# Patient Record
Sex: Female | Born: 1965 | Hispanic: No | Marital: Married | Smoking: Never smoker
Health system: Southern US, Community
[De-identification: ages and names within clinical notes are randomized; demographics above are authoritative.]

---

## 2010-09-07 ENCOUNTER — Emergency Department (HOSPITAL_COMMUNITY)
Admission: EM | Admit: 2010-09-07 | Discharge: 2010-09-07 | Payer: Self-pay | Source: Home / Self Care | Admitting: Emergency Medicine
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
## 2012-06-12 ENCOUNTER — Other Ambulatory Visit (HOSPITAL_COMMUNITY)
Admission: RE | Admit: 2012-06-12 | Discharge: 2012-06-12 | Disposition: A | Discharge status: Home / Self Care | Payer: BC Managed Care – PPO | Source: Ambulatory Visit | Attending: Family Medicine | Admitting: Family Medicine

## 2012-06-12 DIAGNOSIS — Z124 Encounter for screening for malignant neoplasm of cervix: Secondary | ICD-10-CM | POA: Insufficient documentation

## 2015-09-28 ENCOUNTER — Encounter: Payer: Self-pay | Admitting: Women's Health

## 2015-09-28 ENCOUNTER — Other Ambulatory Visit (HOSPITAL_COMMUNITY)
Admission: RE | Admit: 2015-09-28 | Discharge: 2015-09-28 | Disposition: A | Discharge status: Home / Self Care | Payer: Managed Care, Other (non HMO) | Source: Ambulatory Visit | Attending: Women's Health | Admitting: Women's Health

## 2015-09-28 ENCOUNTER — Ambulatory Visit (INDEPENDENT_AMBULATORY_CARE_PROVIDER_SITE_OTHER): Payer: Managed Care, Other (non HMO) | Admitting: Women's Health

## 2015-09-28 VITALS — BP 110/72 | Ht 62.0 in | Wt 139.6 lb

## 2015-09-28 DIAGNOSIS — Z1151 Encounter for screening for human papillomavirus (HPV): Secondary | ICD-10-CM | POA: Insufficient documentation

## 2015-09-28 DIAGNOSIS — Z01419 Encounter for gynecological examination (general) (routine) without abnormal findings: Secondary | ICD-10-CM | POA: Diagnosis not present

## 2015-09-28 DIAGNOSIS — Z1329 Encounter for screening for other suspected endocrine disorder: Secondary | ICD-10-CM | POA: Diagnosis not present

## 2015-09-28 DIAGNOSIS — Z01411 Encounter for gynecological examination (general) (routine) with abnormal findings: Secondary | ICD-10-CM | POA: Insufficient documentation

## 2015-09-28 DIAGNOSIS — Z13220 Encounter for screening for lipoid disorders: Secondary | ICD-10-CM | POA: Diagnosis not present

## 2015-09-28 LAB — COMPREHENSIVE METABOLIC PANEL
ALBUMIN: 4 g/dL (ref 3.6–5.1)
ALK PHOS: 72 U/L (ref 33–115)
ALT: 17 U/L (ref 6–29)
AST: 22 U/L (ref 10–35)
BILIRUBIN TOTAL: 0.3 mg/dL (ref 0.2–1.2)
BUN: 14 mg/dL (ref 7–25)
CALCIUM: 9.2 mg/dL (ref 8.6–10.2)
CO2: 29 mmol/L (ref 20–31)
CREATININE: 0.64 mg/dL (ref 0.50–1.10)
Chloride: 101 mmol/L (ref 98–110)
Glucose, Bld: 72 mg/dL (ref 65–99)
Potassium: 3.9 mmol/L (ref 3.5–5.3)
Sodium: 139 mmol/L (ref 135–146)
TOTAL PROTEIN: 6.8 g/dL (ref 6.1–8.1)

## 2015-09-28 LAB — CBC WITH DIFFERENTIAL/PLATELET
Basophils Absolute: 0 10*3/uL (ref 0.0–0.1)
Basophils Relative: 0 % (ref 0–1)
Eosinophils Absolute: 0.3 10*3/uL (ref 0.0–0.7)
Eosinophils Relative: 4 % (ref 0–5)
HEMATOCRIT: 36.5 % (ref 36.0–46.0)
HEMOGLOBIN: 11.8 g/dL — AB (ref 12.0–15.0)
LYMPHS PCT: 28 % (ref 12–46)
Lymphs Abs: 1.8 10*3/uL (ref 0.7–4.0)
MCH: 27 pg (ref 26.0–34.0)
MCHC: 32.3 g/dL (ref 30.0–36.0)
MCV: 83.5 fL (ref 78.0–100.0)
MONO ABS: 0.4 10*3/uL (ref 0.1–1.0)
MPV: 9.6 fL (ref 8.6–12.4)
Monocytes Relative: 7 % (ref 3–12)
NEUTROS ABS: 3.9 10*3/uL (ref 1.7–7.7)
NEUTROS PCT: 61 % (ref 43–77)
Platelets: 286 10*3/uL (ref 150–400)
RBC: 4.37 MIL/uL (ref 3.87–5.11)
RDW: 12.8 % (ref 11.5–15.5)
WBC: 6.4 10*3/uL (ref 4.0–10.5)

## 2015-09-28 LAB — TSH: TSH: 1.932 u[IU]/mL (ref 0.350–4.500)

## 2015-09-28 LAB — LIPID PANEL
Cholesterol: 144 mg/dL (ref 125–200)
HDL: 67 mg/dL (ref >=46)
LDL CALC: 54 mg/dL (ref <130)
TRIGLYCERIDES: 117 mg/dL (ref <150)
Total CHOL/HDL Ratio: 2.1 Ratio (ref <=5.0)
VLDL: 23 mg/dL (ref <30)

## 2015-09-28 NOTE — Patient Instructions (Signed)
Health Maintenance, Female Adopting a healthy lifestyle and getting preventive care can go a long way to promote health and wellness. Talk with your health care provider about what schedule of regular examinations is right for you. This is a good chance for you to check in with your provider about disease prevention and staying healthy. In between checkups, there are plenty of things you can do on your own. Experts have done a lot of research about which lifestyle changes and preventive measures are most likely to keep you healthy. Ask your health care provider for more information. WEIGHT AND DIET  Eat a healthy diet  Be sure to include plenty of vegetables, fruits, low-fat dairy products, and lean protein.  Do not eat a lot of foods high in solid fats, added sugars, or salt.  Get regular exercise. This is one of the most important things you can do for your health.  Most adults should exercise for at least 150 minutes each week. The exercise should increase your heart rate and make you sweat (moderate-intensity exercise).  Most adults should also do strengthening exercises at least twice a week. This is in addition to the moderate-intensity exercise.  Maintain a healthy weight  Body mass index (BMI) is a measurement that can be used to identify possible weight problems. It estimates body fat based on height and weight. Your health care provider can help determine your BMI and help you achieve or maintain a healthy weight.  For females 20 years of age and older:   A BMI below 18.5 is considered underweight.  A BMI of 18.5 to 24.9 is normal.  A BMI of 25 to 29.9 is considered overweight.  A BMI of 30 and above is considered obese.  Watch levels of cholesterol and blood lipids  You should start having your blood tested for lipids and cholesterol at 47 years of age, then have this test every 5 years.  You may need to have your cholesterol levels checked more often if:  Your lipid  or cholesterol levels are high.  You are older than 47 years of age.  You are at high risk for heart disease.  CANCER SCREENING   Lung Cancer  Lung cancer screening is recommended for adults 55-80 years old who are at high risk for lung cancer because of a history of smoking.  A yearly low-dose CT scan of the lungs is recommended for people who:  Currently smoke.  Have quit within the past 15 years.  Have at least a 30-pack-year history of smoking. A pack year is smoking an average of one pack of cigarettes a day for 1 year.  Yearly screening should continue until it has been 15 years since you quit.  Yearly screening should stop if you develop a health problem that would prevent you from having lung cancer treatment.  Breast Cancer  Practice breast self-awareness. This means understanding how your breasts normally appear and feel.  It also means doing regular breast self-exams. Let your health care provider know about any changes, no matter how small.  If you are in your 20s or 30s, you should have a clinical breast exam (CBE) by a health care provider every 1-3 years as part of a regular health exam.  If you are 40 or older, have a CBE every year. Also consider having a breast X-ray (mammogram) every year.  If you have a family history of breast cancer, talk to your health care provider about genetic screening.  If you   are at high risk for breast cancer, talk to your health care provider about having an MRI and a mammogram every year.  Breast cancer gene (BRCA) assessment is recommended for women who have family members with BRCA-related cancers. BRCA-related cancers include:  Breast.  Ovarian.  Tubal.  Peritoneal cancers.  Results of the assessment will determine the need for genetic counseling and BRCA1 and BRCA2 testing. Cervical Cancer Your health care provider may recommend that you be screened regularly for cancer of the pelvic organs (ovaries, uterus, and  vagina). This screening involves a pelvic examination, including checking for microscopic changes to the surface of your cervix (Pap test). You may be encouraged to have this screening done every 3 years, beginning at age 21.  For women ages 30-65, health care providers may recommend pelvic exams and Pap testing every 3 years, or they may recommend the Pap and pelvic exam, combined with testing for human papilloma virus (HPV), every 5 years. Some types of HPV increase your risk of cervical cancer. Testing for HPV may also be done on women of any age with unclear Pap test results.  Other health care providers may not recommend any screening for nonpregnant women who are considered low risk for pelvic cancer and who do not have symptoms. Ask your health care provider if a screening pelvic exam is right for you.  If you have had past treatment for cervical cancer or a condition that could lead to cancer, you need Pap tests and screening for cancer for at least 20 years after your treatment. If Pap tests have been discontinued, your risk factors (such as having a new sexual partner) need to be reassessed to determine if screening should resume. Some women have medical problems that increase the chance of getting cervical cancer. In these cases, your health care provider may recommend more frequent screening and Pap tests. Colorectal Cancer  This type of cancer can be detected and often prevented.  Routine colorectal cancer screening usually begins at 47 years of age and continues through 47 years of age.  Your health care provider may recommend screening at an earlier age if you have risk factors for colon cancer.  Your health care provider may also recommend using home test kits to check for hidden blood in the stool.  A small camera at the end of a tube can be used to examine your colon directly (sigmoidoscopy or colonoscopy). This is done to check for the earliest forms of colorectal  cancer.  Routine screening usually begins at age 50.  Direct examination of the colon should be repeated every 5-10 years through 47 years of age. However, you may need to be screened more often if early forms of precancerous polyps or small growths are found. Skin Cancer  Check your skin from head to toe regularly.  Tell your health care provider about any new moles or changes in moles, especially if there is a change in a mole's shape or color.  Also tell your health care provider if you have a mole that is larger than the size of a pencil eraser.  Always use sunscreen. Apply sunscreen liberally and repeatedly throughout the day.  Protect yourself by wearing long sleeves, pants, a wide-brimmed hat, and sunglasses whenever you are outside. HEART DISEASE, DIABETES, AND HIGH BLOOD PRESSURE   High blood pressure causes heart disease and increases the risk of stroke. High blood pressure is more likely to develop in:  People who have blood pressure in the high end   of the normal range (130-139/85-89 mm Hg).  People who are overweight or obese.  People who are African American.  If you are 38-23 years of age, have your blood pressure checked every 3-5 years. If you are 61 years of age or older, have your blood pressure checked every year. You should have your blood pressure measured twice--once when you are at a hospital or clinic, and once when you are not at a hospital or clinic. Record the average of the two measurements. To check your blood pressure when you are not at a hospital or clinic, you can use:  An automated blood pressure machine at a pharmacy.  A home blood pressure monitor.  If you are between 45 years and 39 years old, ask your health care provider if you should take aspirin to prevent strokes.  Have regular diabetes screenings. This involves taking a blood sample to check your fasting blood sugar level.  If you are at a normal weight and have a low risk for diabetes,  have this test once every three years after 47 years of age.  If you are overweight and have a high risk for diabetes, consider being tested at a younger age or more often. PREVENTING INFECTION  Hepatitis B  If you have a higher risk for hepatitis B, you should be screened for this virus. You are considered at high risk for hepatitis B if:  You were born in a country where hepatitis B is common. Ask your health care provider which countries are considered high risk.  Your parents were born in a high-risk country, and you have not been immunized against hepatitis B (hepatitis B vaccine).  You have HIV or AIDS.  You use needles to inject street drugs.  You live with someone who has hepatitis B.  You have had sex with someone who has hepatitis B.  You get hemodialysis treatment.  You take certain medicines for conditions, including cancer, organ transplantation, and autoimmune conditions. Hepatitis C  Blood testing is recommended for:  Everyone born from [birth year] through 1963.  Anyone with known risk factors for hepatitis C. Sexually transmitted infections (STIs)  You should be screened for sexually transmitted infections (STIs) including gonorrhea and chlamydia if:  You are sexually active and are younger than 47 years of age.  You are older than 47 years of age and your health care provider tells you that you are at risk for this type of infection.  Your sexual activity has changed since you were last screened and you are at an increased risk for chlamydia or gonorrhea. Ask your health care provider if you are at risk.  If you do not have HIV, but are at risk, it may be recommended that you take a prescription medicine daily to prevent HIV infection. This is called pre-exposure prophylaxis (PrEP). You are considered at risk if:  You are sexually active and do not regularly use condoms or know the HIV status of your partner(s).  You take drugs by injection.  You are sexually  active with a partner who has HIV. Talk with your health care provider about whether you are at high risk of being infected with HIV. If you choose to begin PrEP, you should first be tested for HIV. You should then be tested every 3 months for as long as you are taking PrEP.  PREGNANCY   If you are premenopausal and you may become pregnant, ask your health care provider about preconception counseling.  If you may  become pregnant, take 400 to 800 micrograms (mcg) of folic acid every day.  If you want to prevent pregnancy, talk to your health care provider about birth control (contraception). OSTEOPOROSIS AND MENOPAUSE   Osteoporosis is a disease in which the bones lose minerals and strength with aging. This can result in serious bone fractures. Your risk for osteoporosis can be identified using a bone density scan.  If you are 61 years of age or older, or if you are at risk for osteoporosis and fractures, ask your health care provider if you should be screened.  Ask your health care provider whether you should take a calcium or vitamin D supplement to lower your risk for osteoporosis.  Menopause may have certain physical symptoms and risks.  Hormone replacement therapy may reduce some of these symptoms and risks. Talk to your health care provider about whether hormone replacement therapy is right for you.  HOME CARE INSTRUCTIONS   Schedule regular health, dental, and eye exams.  Stay current with your immunizations.   Do not use any tobacco products including cigarettes, chewing tobacco, or electronic cigarettes.  If you are pregnant, do not drink alcohol.  If you are breastfeeding, limit how much and how often you drink alcohol.  Limit alcohol intake to no more than 1 drink per day for nonpregnant women. One drink equals 12 ounces of beer, 5 ounces of wine, or 1 ounces of hard liquor.  Do not use street drugs.  Do not share needles.  Ask your health care provider for help if  you need support or information about quitting drugs.  Tell your health care provider if you often feel depressed.  Tell your health care provider if you have ever been abused or do not feel safe at home.   This information is not intended to replace advice given to you by your health care provider. Make sure you discuss any questions you have with your health care provider.   Document Released: 04/30/2011 Document Revised: 11/05/2014 Document Reviewed: 09/16/2013 Elsevier Interactive Patient Education [employer].

## 2015-09-28 NOTE — Progress Notes (Signed)
Lamara Brecht 1965-12-27 [id number]    History:    Presents for new patient annual exam.  Monthly cycle/BTL. States cycles are changing slightly in the past year and are more unpredictable in timing, monthly and no bleeding between cycles. Reports normal Pap history 1 mammogram several years ago normal.  Past medical history, past surgical history, family history and social history were all reviewed and documented in the EPIC chart. 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 Father diabetes. Soft mobile 3 cm questionable lipoma on right neck biopsied negative several years ago in [place], no change.  ROS:  A ROS was performed and pertinent positives and negatives are included.  Exam:  Filed Vitals:   09/28/15 1407  BP: 110/72    General appearance:  Normal Thyroid:  Symmetrical, normal in size, without palpable masses or nodularity. Respiratory  Auscultation:  Clear without wheezing or rhonchi Cardiovascular  Auscultation:  Regular rate, without rubs, murmurs or gallops  Edema/varicosities:  Not grossly evident Abdominal  Soft,nontender, without masses, guarding or rebound.  Liver/spleen:  No organomegaly noted  Hernia:  None appreciated  Skin  Inspection:  Grossly normal   Breasts: Examined lying and sitting.     Right: Without masses, retractions, discharge or axillary adenopathy.     Left: Without masses, retractions, discharge or axillary adenopathy. Gentitourinary   Inguinal/mons:  Normal without inguinal adenopathy  External genitalia:  Normal  BUS/Urethra/Skene's glands:  Normal  Vagina:  Normal  Cervix:  Normal  Uterus:   normal in size, shape and contour.  Midline and mobile  Adnexa/parametria:     Rt: Without masses or tenderness.   Lt: Without masses or tenderness.  Anus and perineum: Normal  Digital rectal exam: Normal sphincter tone without palpated masses or tenderness  Assessment/Plan:  49 y.o. MAF G2  P2 for annual exam with complaints of leg, foot and neck discomfort.  Monthly cycle/BTL Perimenopausal  Plan: Encouraged orthotics for shoes, proper lifting, reviewed most of discomfort sounds related to requirements of job. Regular exercise, vitamin D supplement encouraged. Vegetarian. SBE's, annual screening mammogram reviewed and encouraged breast center information given instructed to schedule. CBC, TSH, lipid panel, UA, Pap with HR HPV typing.    [place], 4:59 PM 09/28/2015

## 2015-10-03 LAB — CYTOLOGY - PAP

## 2016-09-28 ENCOUNTER — Other Ambulatory Visit: Payer: Self-pay | Admitting: Women's Health

## 2016-09-28 ENCOUNTER — Encounter: Payer: Self-pay | Admitting: Women's Health

## 2016-09-28 ENCOUNTER — Ambulatory Visit (INDEPENDENT_AMBULATORY_CARE_PROVIDER_SITE_OTHER): Payer: Managed Care, Other (non HMO) | Admitting: Women's Health

## 2016-09-28 VITALS — BP 124/80 | Ht 62.0 in | Wt 132.0 lb

## 2016-09-28 DIAGNOSIS — Z01419 Encounter for gynecological examination (general) (routine) without abnormal findings: Secondary | ICD-10-CM

## 2016-09-28 DIAGNOSIS — F5101 Primary insomnia: Secondary | ICD-10-CM | POA: Diagnosis not present

## 2016-09-28 DIAGNOSIS — Z13220 Encounter for screening for lipoid disorders: Secondary | ICD-10-CM | POA: Diagnosis not present

## 2016-09-28 DIAGNOSIS — Z1329 Encounter for screening for other suspected endocrine disorder: Secondary | ICD-10-CM | POA: Diagnosis not present

## 2016-09-28 DIAGNOSIS — Z1231 Encounter for screening mammogram for malignant neoplasm of breast: Secondary | ICD-10-CM

## 2016-09-28 LAB — CBC WITH DIFFERENTIAL/PLATELET
Basophils Absolute: 0 cells/uL (ref 0–200)
Basophils Relative: 0 %
EOS PCT: 4 %
Eosinophils Absolute: 220 cells/uL (ref 15–500)
HCT: 37.5 % (ref 35.0–45.0)
HEMOGLOBIN: 12.1 g/dL (ref 11.7–15.5)
LYMPHS ABS: 2200 {cells}/uL (ref 850–3900)
Lymphocytes Relative: 40 %
MCH: 27.6 pg (ref 27.0–33.0)
MCHC: 32.3 g/dL (ref 32.0–36.0)
MCV: 85.4 fL (ref 80.0–100.0)
MONOS PCT: 6 %
MPV: 9.6 fL (ref 7.5–12.5)
Monocytes Absolute: 330 cells/uL (ref 200–950)
NEUTROS ABS: 2750 {cells}/uL (ref 1500–7800)
NEUTROS PCT: 50 %
PLATELETS: 250 10*3/uL (ref 140–400)
RBC: 4.39 MIL/uL (ref 3.80–5.10)
RDW: 13.2 % (ref 11.0–15.0)
WBC: 5.5 10*3/uL (ref 3.8–10.8)

## 2016-09-28 LAB — LIPID PANEL
Cholesterol: 141 mg/dL (ref <200)
HDL: 60 mg/dL (ref >50)
LDL CALC: 56 mg/dL (ref <100)
TRIGLYCERIDES: 124 mg/dL (ref <150)
Total CHOL/HDL Ratio: 2.4 Ratio (ref <5.0)
VLDL: 25 mg/dL (ref <30)

## 2016-09-28 LAB — COMPREHENSIVE METABOLIC PANEL
ALBUMIN: 4.1 g/dL (ref 3.6–5.1)
ALT: 13 U/L (ref 6–29)
AST: 20 U/L (ref 10–35)
Alkaline Phosphatase: 66 U/L (ref 33–115)
BILIRUBIN TOTAL: 0.2 mg/dL (ref 0.2–1.2)
BUN: 14 mg/dL (ref 7–25)
CHLORIDE: 102 mmol/L (ref 98–110)
CO2: 27 mmol/L (ref 20–31)
CREATININE: 0.63 mg/dL (ref 0.50–1.10)
Calcium: 8.8 mg/dL (ref 8.6–10.2)
GLUCOSE: 68 mg/dL (ref 65–99)
Potassium: 3.6 mmol/L (ref 3.5–5.3)
SODIUM: 138 mmol/L (ref 135–146)
Total Protein: 6.7 g/dL (ref 6.1–8.1)

## 2016-09-28 LAB — TSH: TSH: 2.49 m[IU]/L

## 2016-09-28 MED ORDER — ZOLPIDEM TARTRATE 10 MG PO TABS: 10.0000 mg | ORAL_TABLET | Freq: Every evening | ORAL | 0 refills | Status: DC | PRN

## 2016-09-28 NOTE — Progress Notes (Signed)
Brittany Zhang 10/21/1966 [id number]    History:    Presents for annual exam.  Cycles have become more irregular this past year, had been every month now every 1-3 months. BTL. Normal Pap and mammogram history, overdue for mammogram. Has a questionable lipoma on right side of neck approximately 3 cm that has been present for greater than 8 years but in the past year has become larger and is now causing a discomfort that radiates up the side of her head behind her right ear. Reports had a negative biopsy of the site in [place] in the past. (Originally from [place] Complaint of insomnia states unable to sleep well ever, denies any stress or problems in life.  Past medical history, past surgical history, family history and social history were all reviewed and documented in the EPIC chart. 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 Son currently in medical school in [place]. Daughter at College here. Father diabetes.  ROS:  A ROS was performed and pertinent positives and negatives are included.  Exam:  Vitals:   09/28/16 1403  BP: 124/80  Weight: 132 lb (59.9 kg)  Height: 5' 2" (1.575 m)   Body mass index is 24.14 kg/m.   General appearance:  Normal Thyroid:  Symmetrical, normal in size, without palpable masses or nodularity. Respiratory  Auscultation:  Clear without wheezing or rhonchi Cardiovascular  Auscultation:  Regular rate, without rubs, murmurs or gallops  Edema/varicosities:  Not grossly evident Abdominal  Soft,nontender, without masses, guarding or rebound.  Liver/spleen:  No organomegaly noted  Hernia:  None appreciated  Skin  Inspection:  Grossly normal   Breasts: Examined lying and sitting.     Right: Without masses, retractions, discharge or axillary adenopathy.     Left: Without masses, retractions, discharge or axillary adenopathy. Gentitourinary   Inguinal/mons:  Normal without inguinal  adenopathy  External genitalia:  Normal  BUS/Urethra/Skene's glands:  Normal  Vagina:  Normal  Cervix:  Normal  Uterus:   normal in size, shape and contour.  Midline and mobile  Adnexa/parametria:     Rt: Without masses or tenderness.   Lt: Without masses or tenderness.  Anus and perineum: Normal  Digital rectal exam: Normal sphincter tone without palpated masses or tenderness  Assessment/Plan:  50 y.o. MAF G2 P2  for annual exam.    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 Overdue for mammogram, breast center information given instructed to schedule. Reviewed importance of annual screen. Long discussion on insomnia, sleep hygiene. Ambien 10 mg will start with half tablet, instructions reviewed. Instructed not to take greater than one week if no relief of insomnia instructed to call. Encouraged orthotics for work shoes. Exercise, calcium rich diet, vitamin D 1000 daily encouraged. CBC, lipid panel, vitamin D, CMP, UA, Pap normal with negative HR HPV 2016, new screening guidelines reviewed.    YOUNG,NANCY J WHNP, 3:52 PM 09/28/2016 

## 2016-09-28 NOTE — Patient Instructions (Signed)
Breast center for mammogram  [phone] Insomnia Insomnia is a sleep disorder that makes it difficult to fall asleep or to stay asleep. Insomnia can cause tiredness (fatigue), low energy, difficulty concentrating, mood swings, and poor performance at work or school. There are three different ways to classify insomnia:  Difficulty falling asleep.  Difficulty staying asleep.  Waking up too early in the morning. Any type of insomnia can be long-term (chronic) or short-term (acute). Both are common. Short-term insomnia usually lasts for three months or less. Chronic insomnia occurs at least three times a week for longer than three months. What are the causes? Insomnia may be caused by another condition, situation, or substance, such as:  Anxiety.  Certain medicines.  Gastroesophageal reflux disease (GERD) or other gastrointestinal conditions.  Asthma or other breathing conditions.  Restless legs syndrome, sleep apnea, or other sleep disorders.  Chronic pain.  Menopause. This may include hot flashes.  Stroke.  Abuse of alcohol, tobacco, or illegal drugs.  Depression.  Caffeine.  Neurological disorders, such as Alzheimer disease.  An overactive thyroid (hyperthyroidism). The cause of insomnia may not be known. What increases the risk? Risk factors for insomnia include:  Gender. Women are more commonly affected than men.  Age. Insomnia is more common as you get older.  Stress. This may involve your professional or personal life.  Income. Insomnia is more common in people with lower income.  Lack of exercise.  Irregular work schedule or night shifts.  Traveling between different time zones. What are the signs or symptoms? If you have insomnia, trouble falling asleep or trouble staying asleep is the main symptom. This may lead to other symptoms, such as:  Feeling fatigued.  Feeling nervous about going to sleep.  Not feeling rested in the morning.  Having trouble  concentrating.  Feeling irritable, anxious, or depressed. How is this treated? Treatment for insomnia depends on the cause. If your insomnia is caused by an underlying condition, treatment will focus on addressing the condition. Treatment may also include:  Medicines to help you sleep.  Counseling or therapy.  Lifestyle adjustments. Follow these instructions at home:  Take medicines only as directed by your health care provider.  Keep regular sleeping and waking hours. Avoid naps.  Keep a sleep diary to help you and your health care provider figure out what could be causing your insomnia. Include:  When you sleep.  When you wake up during the night.  How well you sleep.  How rested you feel the next day.  Any side effects of medicines you are taking.  What you eat and drink.  Make your bedroom a comfortable place where it is easy to fall asleep:  Put up shades or special blackout curtains to block light from outside.  Use a white noise machine to block noise.  Keep the temperature cool.  Exercise regularly as directed by your health care provider. Avoid exercising right before bedtime.  Use relaxation techniques to manage stress. Ask your health care provider to suggest some techniques that may work well for you. These may include:  Breathing exercises.  Routines to release muscle tension.  Visualizing peaceful scenes.  Cut back on alcohol, caffeinated beverages, and cigarettes, especially close to bedtime. These can disrupt your sleep.  Do not overeat or eat spicy foods right before bedtime. This can lead to digestive discomfort that can make it hard for you to sleep.  Limit screen use before bedtime. This includes:  Watching TV.  Using your smartphone, tablet,  and computer.  Stick to a routine. This can help you fall asleep faster. Try to do a quiet activity, brush your teeth, and go to bed at the same time each night.  Get out of bed if you are still  awake after 15 minutes of trying to sleep. Keep the lights down, but try reading or doing a quiet activity. When you feel sleepy, go back to bed.  Make sure that you drive carefully. Avoid driving if you feel very sleepy.  Keep all follow-up appointments as directed by your health care provider. This is important. Contact a health care provider if:  You are tired throughout the day or have trouble in your daily routine due to sleepiness.  You continue to have sleep problems or your sleep problems get worse. Get help right away if:  You have serious thoughts about hurting yourself or someone else. This information is not intended to replace advice given to you by your health care provider. Make sure you discuss any questions you have with your health care provider. Document Released: 10/12/2000 Document Revised: 03/16/2016 Document Reviewed: 07/16/2014 Elsevier Interactive Patient Education  2017 [place] Maintenance, Female Introduction Adopting a healthy lifestyle and getting preventive care can go a long way to promote health and wellness. Talk with your health care provider about what schedule of regular examinations is right for you. This is a good chance for you to check in with your provider about disease prevention and staying healthy. In between checkups, there are plenty of things you can do on your own. Experts have done a lot of research about which lifestyle changes and preventive measures are most likely to keep you healthy. Ask your health care provider for more information. Weight and diet Eat a healthy diet  Be sure to include plenty of vegetables, fruits, low-fat dairy products, and lean protein.  Do not eat a lot of foods high in solid fats, added sugars, or salt.  Get regular exercise. This is one of the most important things you can do for your health.  Most adults should exercise for at least 150 minutes each week. The exercise should increase your heart  rate and make you sweat (moderate-intensity exercise).  Most adults should also do strengthening exercises at least twice a week. This is in addition to the moderate-intensity exercise. Maintain a healthy weight  Body mass index (BMI) is a measurement that can be used to identify possible weight problems. It estimates body fat based on height and weight. Your health care provider can help determine your BMI and help you achieve or maintain a healthy weight.  For females 66 years of age and older:  A BMI below 18.5 is considered underweight.  A BMI of 18.5 to 24.9 is normal.  A BMI of 25 to 29.9 is considered overweight.  A BMI of 30 and above is considered obese. Watch levels of cholesterol and blood lipids  You should start having your blood tested for lipids and cholesterol at 48 years of age, then have this test every 5 years.  You may need to have your cholesterol levels checked more often if:  Your lipid or cholesterol levels are high.  You are older than 48 years of age.  You are at high risk for heart disease. Cancer screening Lung Cancer  Lung cancer screening is recommended for adults 55-39 years old who are at high risk for lung cancer because of a history of smoking.  A yearly low-dose CT scan  of the lungs is recommended for people who:  Currently smoke.  Have quit within the past 15 years.  Have at least a 30-pack-year history of smoking. A pack year is smoking an average of one pack of cigarettes a day for 1 year.  Yearly screening should continue until it has been 15 years since you quit.  Yearly screening should stop if you develop a health problem that would prevent you from having lung cancer treatment. Breast Cancer  Practice breast self-awareness. This means understanding how your breasts normally appear and feel.  It also means doing regular breast self-exams. Let your health care provider know about any changes, no matter how small.  If you are in  your 20s or 30s, you should have a clinical breast exam (CBE) by a health care provider every 1-3 years as part of a regular health exam.  If you are 74 or older, have a CBE every year. Also consider having a breast X-ray (mammogram) every year.  If you have a family history of breast cancer, talk to your health care provider about genetic screening.  If you are at high risk for breast cancer, talk to your health care provider about having an MRI and a mammogram every year.  Breast cancer gene (BRCA) assessment is recommended for women who have family members with BRCA-related cancers. BRCA-related cancers include:  Breast.  Ovarian.  Tubal.  Peritoneal cancers.  Results of the assessment will determine the need for genetic counseling and BRCA1 and BRCA2 testing. Cervical Cancer  Your health care provider may recommend that you be screened regularly for cancer of the pelvic organs (ovaries, uterus, and vagina). This screening involves a pelvic examination, including checking for microscopic changes to the surface of your cervix (Pap test). You may be encouraged to have this screening done every 3 years, beginning at age 48.  For women ages 31-65, health care providers may recommend pelvic exams and Pap testing every 3 years, or they may recommend the Pap and pelvic exam, combined with testing for human papilloma virus (HPV), every 5 years. Some types of HPV increase your risk of cervical cancer. Testing for HPV may also be done on women of any age with unclear Pap test results.  Other health care providers may not recommend any screening for nonpregnant women who are considered low risk for pelvic cancer and who do not have symptoms. Ask your health care provider if a screening pelvic exam is right for you.  If you have had past treatment for cervical cancer or a condition that could lead to cancer, you need Pap tests and screening for cancer for at least 20 years after your treatment. If  Pap tests have been discontinued, your risk factors (such as having a new sexual partner) need to be reassessed to determine if screening should resume. Some women have medical problems that increase the chance of getting cervical cancer. In these cases, your health care provider may recommend more frequent screening and Pap tests. Colorectal Cancer  This type of cancer can be detected and often prevented.  Routine colorectal cancer screening usually begins at 48 years of age and continues through 48 years of age.  Your health care provider may recommend screening at an earlier age if you have risk factors for colon cancer.  Your health care provider may also recommend using home test kits to check for hidden blood in the stool.  A small camera at the end of a tube can be used to  examine your colon directly (sigmoidoscopy or colonoscopy). This is done to check for the earliest forms of colorectal cancer.  Routine screening usually begins at age 20.  Direct examination of the colon should be repeated every 5-10 years through 48 years of age. However, you may need to be screened more often if early forms of precancerous polyps or small growths are found. Skin Cancer  Check your skin from head to toe regularly.  Tell your health care provider about any new moles or changes in moles, especially if there is a change in a mole's shape or color.  Also tell your health care provider if you have a mole that is larger than the size of a pencil eraser.  Always use sunscreen. Apply sunscreen liberally and repeatedly throughout the day.  Protect yourself by wearing long sleeves, pants, a wide-brimmed hat, and sunglasses whenever you are outside. Heart disease, diabetes, and high blood pressure  High blood pressure causes heart disease and increases the risk of stroke. High blood pressure is more likely to develop in:  People who have blood pressure in the high end of the normal range (130-139/85-89  mm Hg).  People who are overweight or obese.  People who are African American.  If you are 65-22 years of age, have your blood pressure checked every 3-5 years. If you are 13 years of age or older, have your blood pressure checked every year. You should have your blood pressure measured twice-once when you are at a hospital or clinic, and once when you are not at a hospital or clinic. Record the average of the two measurements. To check your blood pressure when you are not at a hospital or clinic, you can use:  An automated blood pressure machine at a pharmacy.  A home blood pressure monitor.  If you are between 18 years and 65 years old, ask your health care provider if you should take aspirin to prevent strokes.  Have regular diabetes screenings. This involves taking a blood sample to check your fasting blood sugar level.  If you are at a normal weight and have a low risk for diabetes, have this test once every three years after 48 years of age.  If you are overweight and have a high risk for diabetes, consider being tested at a younger age or more often. Preventing infection Hepatitis B  If you have a higher risk for hepatitis B, you should be screened for this virus. You are considered at high risk for hepatitis B if:  You were born in a country where hepatitis B is common. Ask your health care provider which countries are considered high risk.  Your parents were born in a high-risk country, and you have not been immunized against hepatitis B (hepatitis B vaccine).  You have HIV or AIDS.  You use needles to inject street drugs.  You live with someone who has hepatitis B.  You have had sex with someone who has hepatitis B.  You get hemodialysis treatment.  You take certain medicines for conditions, including cancer, organ transplantation, and autoimmune conditions. Hepatitis C  Blood testing is recommended for:  Everyone born from [birth year] through 1963.  Anyone with known  risk factors for hepatitis C. Sexually transmitted infections (STIs)  You should be screened for sexually transmitted infections (STIs) including gonorrhea and chlamydia if:  You are sexually active and are younger than 48 years of age.  You are older than 48 years of age and your health care provider  tells you that you are at risk for this type of infection.  Your sexual activity has changed since you were last screened and you are at an increased risk for chlamydia or gonorrhea. Ask your health care provider if you are at risk.  If you do not have HIV, but are at risk, it may be recommended that you take a prescription medicine daily to prevent HIV infection. This is called pre-exposure prophylaxis (PrEP). You are considered at risk if:  You are sexually active and do not regularly use condoms or know the HIV status of your partner(s).  You take drugs by injection.  You are sexually active with a partner who has HIV. Talk with your health care provider about whether you are at high risk of being infected with HIV. If you choose to begin PrEP, you should first be tested for HIV. You should then be tested every 3 months for as long as you are taking PrEP. Pregnancy  If you are premenopausal and you may become pregnant, ask your health care provider about preconception counseling.  If you may become pregnant, take 400 to 800 micrograms (mcg) of folic acid every day.  If you want to prevent pregnancy, talk to your health care provider about birth control (contraception). Osteoporosis and menopause  Osteoporosis is a disease in which the bones lose minerals and strength with aging. This can result in serious bone fractures. Your risk for osteoporosis can be identified using a bone density scan.  If you are 66 years of age or older, or if you are at risk for osteoporosis and fractures, ask your health care provider if you should be screened.  Ask your health care provider whether you  should take a calcium or vitamin D supplement to lower your risk for osteoporosis.  Menopause may have certain physical symptoms and risks.  Hormone replacement therapy may reduce some of these symptoms and risks. Talk to your health care provider about whether hormone replacement therapy is right for you. Follow these instructions at home:  Schedule regular health, dental, and eye exams.  Stay current with your immunizations.  Do not use any tobacco products including cigarettes, chewing tobacco, or electronic cigarettes.  If you are pregnant, do not drink alcohol.  If you are breastfeeding, limit how much and how often you drink alcohol.  Limit alcohol intake to no more than 1 drink per day for nonpregnant women. One drink equals 12 ounces of beer, 5 ounces of wine, or 1 ounces of hard liquor.  Do not use street drugs.  Do not share needles.  Ask your health care provider for help if you need support or information about quitting drugs.  Tell your health care provider if you often feel depressed.  Tell your health care provider if you have ever been abused or do not feel safe at home. This information is not intended to replace advice given to you by your health care provider. Make sure you discuss any questions you have with your health care provider. Document Released: 04/30/2011 Document Revised: 03/22/2016 Document Reviewed: 07/19/2015  2017 Elsevier

## 2016-09-29 LAB — URINALYSIS W MICROSCOPIC + REFLEX CULTURE
BACTERIA UA: NONE SEEN [HPF]
BILIRUBIN URINE: NEGATIVE
CRYSTALS: NONE SEEN [HPF]
Casts: NONE SEEN [LPF]
Glucose, UA: NEGATIVE
Ketones, ur: NEGATIVE
Leukocytes, UA: NEGATIVE
Nitrite: NEGATIVE
PROTEIN: NEGATIVE
RBC / HPF: NONE SEEN RBC/HPF (ref <=2)
SQUAMOUS EPITHELIAL / LPF: NONE SEEN [HPF] (ref <=5)
Specific Gravity, Urine: 1.008 (ref 1.001–1.035)
WBC, UA: NONE SEEN WBC/HPF (ref <=5)
YEAST: NONE SEEN [HPF]
pH: 6 (ref 5.0–8.0)

## 2016-09-29 LAB — VITAMIN D 25 HYDROXY (VIT D DEFICIENCY, FRACTURES): VIT D 25 HYDROXY: 32 ng/mL (ref 30–100)

## 2016-10-01 ENCOUNTER — Telehealth: Payer: Self-pay | Admitting: *Deleted

## 2016-10-01 NOTE — Telephone Encounter (Signed)
[facility]erral [facility]ed to [facility] they will fax me back time and date to relay to patient.

## 2016-10-01 NOTE — Telephone Encounter (Signed)
-----   Message from Harrington ChallengerNancy J Young, NP sent at 09/28/2016  3:48 PM EST ----- please schedule surgical consult for questionable 3 cm lipoma on right side outer aspect of neck that is now causing discomfort. Present greater than 8 years, has increased in size and now causing discomfort that radiates up the neck behind her right ear.

## 2016-10-02 NOTE — Telephone Encounter (Signed)
Appointment on 10/05/16 @ 9:30am pt informed with the below note.

## 2016-10-05 ENCOUNTER — Ambulatory Visit: Payer: Self-pay | Admitting: Surgery

## 2016-10-05 NOTE — H&P (Signed)
History of Present Illness Brittany Zhang(Nikolis Berent K. Danyal Adorno MD; 10/05/2016 10:24 AM) Patient words: NP lipoma on right side of neck.  The patient is a 50 year old female who presents with a complaint of Mass. Referred by Maryelizabeth RowanNancy Young, NP This is a 50 year old female in good health who presents with an 8 year history of a slowly enlarging mass on the right side of her neck. Apparently a needle biopsy was unremarkable. [place] was [place] back [place]. This mass has begun to enlarge and is causing some discomfort when she turns her neck. She would like to have this area removed.     Other Problems Aggie Cosier(Robin Gwynn, RMA; 10/05/2016 10:16 AM) No pertinent past medical history  Past Surgical History Aggie Cosier(Robin Gwynn, RMA; 10/05/2016 10:16 AM) No pertinent past surgical history  Diagnostic Studies History Aggie Cosier(Robin Gwynn, RMA; 10/05/2016 10:16 AM) Colonoscopy never Mammogram >3 years ago  Allergies Zella Ball(Robin Gwynn, RMA; 10/05/2016 10:16 AM) No Known Drug Allergies 10/05/2016  Medication History Zella Ball(Robin Gwynn, RMA; 10/05/2016 10:18 AM) Multi Vitamin (Oral) Active. Vitamin D (1000UNIT Tablet, Oral) Active. Medications Reconciled  Social History Aggie Cosier(Robin Gwynn, RMA; 10/05/2016 10:16 AM) No alcohol use No caffeine use No drug use Tobacco use Never smoker.  Family History Aggie Cosier(Robin Gwynn, RMA; 10/05/2016 10:16 AM) Diabetes Mellitus Father. Heart Disease Father. Hypertension Father.  Pregnancy / Birth History Aggie Cosier(Robin Gwynn, RMA; 10/05/2016 10:16 AM) Age at menarche 15 years. Age of menopause 896-50 Gravida 2 Length (months) of breastfeeding 7-12 Maternal age 48-25 Para 2 Regular periods     Review of Systems Zella Ball(Robin Gwynn RMA; 10/05/2016 10:16 AM) General Not Present- Appetite Loss, Chills, Fatigue, Fever, Night Sweats, Weight Gain and Weight Loss. Skin Not Present- Change in Wart/Mole, Dryness, Hives, Jaundice, New Lesions, Non-Healing Wounds, Rash and Ulcer. HEENT Not Present- Earache, Hearing  Loss, Hoarseness, Nose Bleed, Oral Ulcers, Ringing in the Ears, Seasonal Allergies, Sinus Pain, Sore Throat, Visual Disturbances, Wears glasses/contact lenses and Yellow Eyes. Respiratory Not Present- Bloody sputum, Chronic Cough, Difficulty Breathing, Snoring and Wheezing. Breast Not Present- Breast Mass, Breast Pain, Nipple Discharge and Skin Changes. Cardiovascular Not Present- Chest Pain, Difficulty Breathing Lying Down, Leg Cramps, Palpitations, Rapid Heart Rate, Shortness of Breath and Swelling of Extremities. Gastrointestinal Not Present- Abdominal Pain, Bloating, Bloody Stool, Change in Bowel Habits, Chronic diarrhea, Constipation, Difficulty Swallowing, Excessive gas, Gets full quickly at meals, Hemorrhoids, Indigestion, Nausea, Rectal Pain and Vomiting. Female Genitourinary Not Present- Frequency, Nocturia, Painful Urination, Pelvic Pain and Urgency. Musculoskeletal Not Present- Back Pain, Joint Pain, Joint Stiffness, Muscle Pain, Muscle Weakness and Swelling of Extremities. Neurological Not Present- Decreased Memory, Fainting, Headaches, Numbness, Seizures, Tingling, Tremor, Trouble walking and Weakness. Psychiatric Not Present- Anxiety, Bipolar, Change in Sleep Pattern, Depression, Fearful and Frequent crying. Endocrine Not Present- Cold Intolerance, Excessive Hunger, Hair Changes, Heat Intolerance, Hot flashes and New Diabetes. Hematology Not Present- Blood Thinners, Easy Bruising, Excessive bleeding, Gland problems, HIV and Persistent Infections.  Vitals (Robin Gwynn RMA; 10/05/2016 10:17 AM) 10/05/2016 10:17 AM Weight: 130 lb Height: 62in Body Surface Area: 1.59 m Body Mass Index: 23.78 kg/m  Temp.: 98.57F  Pulse: 71 (Regular)  BP: 90/60 (Sitting, Left Arm, Standard)   10/05/2016 10:17 AM Weight: 133 lb      Physical Exam Molli Hazard(Demarious Kapur K. Oneda Duffett MD; 10/05/2016 10:24 AM)  The physical exam findings are as follows: Note:WDWN in NAD Eyes: Pupils equal, round; sclera  anicteric HENT: Oral mucosa moist; good dentition Neck: Palpable visible oval-shaped mass - right neck over SCM near mastoid; soft, mobile; no  thyromegaly Lungs: CTA bilaterally; normal respiratory effort CV: Regular rate and rhythm; no murmurs; extremities well-perfused with no edema Abd: +bowel sounds, soft, non-tender, no palpable organomegaly; no palpable hernias Skin: Warm, dry; no sign of jaundice Psychiatric - alert and oriented x 4; calm mood and affect    Assessment & Plan Molli Hazard(Danecia Underdown K. 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 The surgical procedure has been discussed with the patient. Potential risks, benefits, alternative treatments, and expected outcomes have been explained. All of the patient's questions at this time have been answered. The likelihood of reaching the patient's treatment goal is good. The patient understand the proposed surgical procedure and wishes to proceed.  Brittany ArmsMatthew K. Corliss Skainssuei, MD, [facility]  General/ Trauma Surgery  10/05/2016 10:25 AM

## 2016-10-08 ENCOUNTER — Ambulatory Visit
Admission: RE | Admit: 2016-10-08 | Discharge: 2016-10-08 | Disposition: A | Discharge status: Home / Self Care | Payer: Managed Care, Other (non HMO) | Source: Ambulatory Visit | Attending: Women's Health | Admitting: Women's Health

## 2016-10-08 DIAGNOSIS — Z1231 Encounter for screening mammogram for malignant neoplasm of breast: Secondary | ICD-10-CM

## 2017-03-13 ENCOUNTER — Encounter: Payer: Self-pay | Admitting: Gynecology

## 2017-09-27 ENCOUNTER — Other Ambulatory Visit: Payer: Self-pay | Admitting: Women's Health

## 2017-09-27 DIAGNOSIS — Z1231 Encounter for screening mammogram for malignant neoplasm of breast: Secondary | ICD-10-CM

## 2017-10-01 ENCOUNTER — Ambulatory Visit: Payer: Managed Care, Other (non HMO) | Admitting: Women's Health

## 2017-10-01 ENCOUNTER — Encounter: Payer: Self-pay | Admitting: Women's Health

## 2017-10-01 VITALS — BP 119/79 | Ht 62.0 in | Wt 135.4 lb

## 2017-10-01 DIAGNOSIS — Z01419 Encounter for gynecological examination (general) (routine) without abnormal findings: Secondary | ICD-10-CM | POA: Diagnosis not present

## 2017-10-01 NOTE — Patient Instructions (Signed)
Health Maintenance, Female Adopting a healthy lifestyle and getting preventive care can go a long way to promote health and wellness. Talk with your health care provider about what schedule of regular examinations is right for you. This is a good chance for you to check in with your provider about disease prevention and staying healthy. In between checkups, there are plenty of things you can do on your own. Experts have done a lot of research about which lifestyle changes and preventive measures are most likely to keep you healthy. Ask your health care provider for more information. Weight and diet Eat a healthy diet  Be sure to include plenty of vegetables, fruits, low-fat dairy products, and lean protein.  Do not eat a lot of foods high in solid fats, added sugars, or salt.  Get regular exercise. This is one of the most important things you can do for your health. ? Most adults should exercise for at least 150 minutes each week. The exercise should increase your heart rate and make you sweat (moderate-intensity exercise). ? Most adults should also do strengthening exercises at least twice a week. This is in addition to the moderate-intensity exercise.  Maintain a healthy weight  Body mass index (BMI) is a measurement that can be used to identify possible weight problems. It estimates body fat based on height and weight. Your health care provider can help determine your BMI and help you achieve or maintain a healthy weight.  For females 20 years of age and older: ? A BMI below 18.5 is considered underweight. ? A BMI of 18.5 to 24.9 is normal. ? A BMI of 25 to 29.9 is considered overweight. ? A BMI of 30 and above is considered obese.  Watch levels of cholesterol and blood lipids  You should start having your blood tested for lipids and cholesterol at 49 years of age, then have this test every 5 years.  You may need to have your cholesterol levels checked more often if: ? Your lipid or  cholesterol levels are high. ? You are older than 50 years of age. ? You are at high risk for heart disease.  Cancer screening Lung Cancer  Lung cancer screening is recommended for adults 55-80 years old who are at high risk for lung cancer because of a history of smoking.  A yearly low-dose CT scan of the lungs is recommended for people who: ? Currently smoke. ? Have quit within the past 15 years. ? Have at least a 30-pack-year history of smoking. A pack year is smoking an average of one pack of cigarettes a day for 1 year.  Yearly screening should continue until it has been 15 years since you quit.  Yearly screening should stop if you develop a health problem that would prevent you from having lung cancer treatment.  Breast Cancer  Practice breast self-awareness. This means understanding how your breasts normally appear and feel.  It also means doing regular breast self-exams. Let your health care provider know about any changes, no matter how small.  If you are in your 20s or 30s, you should have a clinical breast exam (CBE) by a health care provider every 1-3 years as part of a regular health exam.  If you are 40 or older, have a CBE every year. Also consider having a breast X-ray (mammogram) every year.  If you have a family history of breast cancer, talk to your health care provider about genetic screening.  If you are at high risk   for breast cancer, talk to your health care provider about having an MRI and a mammogram every year.  Breast cancer gene (BRCA) assessment is recommended for women who have family members with BRCA-related cancers. BRCA-related cancers include: ? Breast. ? Ovarian. ? Tubal. ? Peritoneal cancers.  Results of the assessment will determine the need for genetic counseling and BRCA1 and BRCA2 testing.  Cervical Cancer Your health care provider may recommend that you be screened regularly for cancer of the pelvic organs (ovaries, uterus, and  vagina). This screening involves a pelvic examination, including checking for microscopic changes to the surface of your cervix (Pap test). You may be encouraged to have this screening done every 3 years, beginning at age 22.  For women ages 56-65, health care providers may recommend pelvic exams and Pap testing every 3 years, or they may recommend the Pap and pelvic exam, combined with testing for human papilloma virus (HPV), every 5 years. Some types of HPV increase your risk of cervical cancer. Testing for HPV may also be done on women of any age with unclear Pap test results.  Other health care providers may not recommend any screening for nonpregnant women who are considered low risk for pelvic cancer and who do not have symptoms. Ask your health care provider if a screening pelvic exam is right for you.  If you have had past treatment for cervical cancer or a condition that could lead to cancer, you need Pap tests and screening for cancer for at least 20 years after your treatment. If Pap tests have been discontinued, your risk factors (such as having a new sexual partner) need to be reassessed to determine if screening should resume. Some women have medical problems that increase the chance of getting cervical cancer. In these cases, your health care provider may recommend more frequent screening and Pap tests.  Colorectal Cancer  This type of cancer can be detected and often prevented.  Routine colorectal cancer screening usually begins at 49 years of age and continues through 49 years of age.  Your health care provider may recommend screening at an earlier age if you have risk factors for colon cancer.  Your health care provider may also recommend using home test kits to check for hidden blood in the stool.  A small camera at the end of a tube can be used to examine your colon directly (sigmoidoscopy or colonoscopy). This is done to check for the earliest forms of colorectal  cancer.  Routine screening usually begins at age 33.  Direct examination of the colon should be repeated every 5-10 years through 49 years of age. However, you may need to be screened more often if early forms of precancerous polyps or small growths are found.  Skin Cancer  Check your skin from head to toe regularly.  Tell your health care provider about any new moles or changes in moles, especially if there is a change in a mole's shape or color.  Also tell your health care provider if you have a mole that is larger than the size of a pencil eraser.  Always use sunscreen. Apply sunscreen liberally and repeatedly throughout the day.  Protect yourself by wearing long sleeves, pants, a wide-brimmed hat, and sunglasses whenever you are outside.  Heart disease, diabetes, and high blood pressure  High blood pressure causes heart disease and increases the risk of stroke. High blood pressure is more likely to develop in: ? People who have blood pressure in the high end of  the normal range (130-139/85-89 mm Hg). ? People who are overweight or obese. ? People who are African American.  If you are 21-29 years of age, have your blood pressure checked every 3-5 years. If you are 3 years of age or older, have your blood pressure checked every year. You should have your blood pressure measured twice-once when you are at a hospital or clinic, and once when you are not at a hospital or clinic. Record the average of the two measurements. To check your blood pressure when you are not at a hospital or clinic, you can use: ? An automated blood pressure machine at a pharmacy. ? A home blood pressure monitor.  If you are between 17 years and 37 years old, ask your health care provider if you should take aspirin to prevent strokes.  Have regular diabetes screenings. This involves taking a blood sample to check your fasting blood sugar level. ? If you are at a normal weight and have a low risk for diabetes,  have this test once every three years after 49 years of age. ? If you are overweight and have a high risk for diabetes, consider being tested at a younger age or more often. Preventing infection Hepatitis B  If you have a higher risk for hepatitis B, you should be screened for this virus. You are considered at high risk for hepatitis B if: ? You were born in a country where hepatitis B is common. Ask your health care provider which countries are considered high risk. ? Your parents were born in a high-risk country, and you have not been immunized against hepatitis B (hepatitis B vaccine). ? You have HIV or AIDS. ? You use needles to inject street drugs. ? You live with someone who has hepatitis B. ? You have had sex with someone who has hepatitis B. ? You get hemodialysis treatment. ? You take certain medicines for conditions, including cancer, organ transplantation, and autoimmune conditions.  Hepatitis C  Blood testing is recommended for: ? Everyone born from [birth year] through 1963. ? Anyone with known risk factors for hepatitis C.  Sexually transmitted infections (STIs)  You should be screened for sexually transmitted infections (STIs) including gonorrhea and chlamydia if: ? You are sexually active and are younger than 49 years of age. ? You are older than 49 years of age and your health care provider tells you that you are at risk for this type of infection. ? Your sexual activity has changed since you were last screened and you are at an increased risk for chlamydia or gonorrhea. Ask your health care provider if you are at risk.  If you do not have HIV, but are at risk, it may be recommended that you take a prescription medicine daily to prevent HIV infection. This is called pre-exposure prophylaxis (PrEP). You are considered at risk if: ? You are sexually active and do not regularly use condoms or know the HIV status of your partner(s). ? You take drugs by injection. ? You are  sexually active with a partner who has HIV.  Talk with your health care provider about whether you are at high risk of being infected with HIV. If you choose to begin PrEP, you should first be tested for HIV. You should then be tested every 3 months for as long as you are taking PrEP. Pregnancy  If you are premenopausal and you may become pregnant, ask your health care provider about preconception counseling.  If you may become  pregnant, take 400 to 800 micrograms (mcg) of folic acid every day.  If you want to prevent pregnancy, talk to your health care provider about birth control (contraception). Osteoporosis and menopause  Osteoporosis is a disease in which the bones lose minerals and strength with aging. This can result in serious bone fractures. Your risk for osteoporosis can be identified using a bone density scan.  If you are 65 years of age or older, or if you are at risk for osteoporosis and fractures, ask your health care provider if you should be screened.  Ask your health care provider whether you should take a calcium or vitamin D supplement to lower your risk for osteoporosis.  Menopause may have certain physical symptoms and risks.  Hormone replacement therapy may reduce some of these symptoms and risks. Talk to your health care provider about whether hormone replacement therapy is right for you. Follow these instructions at home:  Schedule regular health, dental, and eye exams.  Stay current with your immunizations.  Do not use any tobacco products including cigarettes, chewing tobacco, or electronic cigarettes.  If you are pregnant, do not drink alcohol.  If you are breastfeeding, limit how much and how often you drink alcohol.  Limit alcohol intake to no more than 1 drink per day for nonpregnant women. One drink equals 12 ounces of beer, 5 ounces of wine, or 1 ounces of hard liquor.  Do not use street drugs.  Do not share needles.  Ask your health care  provider for help if you need support or information about quitting drugs.  Tell your health care provider if you often feel depressed.  Tell your health care provider if you have ever been abused or do not feel safe at home. This information is not intended to replace advice given to you by your health care provider. Make sure you discuss any questions you have with your health care provider. Document Released: 04/30/2011 Document Revised: 03/22/2016 Document Reviewed: 07/19/2015 Elsevier Interactive Patient Education  2018 [employer] Carbohydrate Counting for Diabetes Mellitus, Adult Carbohydrate counting is a method for keeping track of how many carbohydrates you eat. Eating carbohydrates naturally increases the amount of sugar (glucose) in the blood. Counting how many carbohydrates you eat helps keep your blood glucose within normal limits, which helps you manage your diabetes (diabetes mellitus). It is important to know how many carbohydrates you can safely have in each meal. This is different for every person. A diet and nutrition specialist (registered dietitian) can help you make a meal plan and calculate how many carbohydrates you should have at each meal and snack. Carbohydrates are found in the following foods:  Grains, such as breads and cereals.  Dried beans and soy products.  Starchy vegetables, such as potatoes, peas, and corn.  Fruit and fruit juices.  Milk and yogurt.  Sweets and snack foods, such as cake, cookies, candy, chips, and soft drinks.  How do I count carbohydrates? There are two ways to count carbohydrates in food. You can use either of the methods or a combination of both. Reading "Nutrition Facts" on packaged food The "Nutrition Facts" list is included on the labels of almost all packaged foods and beverages in the [place] It includes:  The serving size.  Information about nutrients in each serving, including the grams (g) of carbohydrate per  serving.  To use the "Nutrition Facts":  Decide how many servings you will have.  Multiply the number of servings by the number of carbohydrates   per serving.  The resulting number is the total amount of carbohydrates that you will be having.  Learning standard serving sizes of other foods When you eat foods containing carbohydrates that are not packaged or do not include "Nutrition Facts" on the label, you need to measure the servings in order to count the amount of carbohydrates:  Measure the foods that you will eat with a food scale or measuring cup, if needed.  Decide how many standard-size servings you will eat.  Multiply the number of servings by 15. Most carbohydrate-rich foods have about 15 g of carbohydrates per serving. ? For example, if you eat 8 oz (170 g) of strawberries, you will have eaten 2 servings and 30 g of carbohydrates (2 servings x 15 g = 30 g).  For foods that have more than one food mixed, such as soups and casseroles, you must count the carbohydrates in each food that is included.  The following list contains standard serving sizes of common carbohydrate-rich foods. Each of these servings has about 15 g of carbohydrates:   hamburger bun or  English muffin.   oz (15 mL) syrup.   oz (14 g) jelly.  1 slice of bread.  1 six-inch tortilla.  3 oz (85 g) cooked rice or pasta.  4 oz (113 g) cooked dried beans.  4 oz (113 g) starchy vegetable, such as peas, corn, or potatoes.  4 oz (113 g) hot cereal.  4 oz (113 g) mashed potatoes or  of a large baked potato.  4 oz (113 g) canned or frozen fruit.  4 oz (120 mL) fruit juice.  4-6 crackers.  6 chicken nuggets.  6 oz (170 g) unsweetened dry cereal.  6 oz (170 g) plain fat-free yogurt or yogurt sweetened with artificial sweeteners.  8 oz (240 mL) milk.  8 oz (170 g) fresh fruit or one small piece of fruit.  24 oz (680 g) popped popcorn.  Example of carbohydrate counting Sample meal  3  oz (85 g) chicken breast.  6 oz (170 g) brown rice.  4 oz (113 g) corn.  8 oz (240 mL) milk.  8 oz (170 g) strawberries with sugar-free whipped topping. Carbohydrate calculation 1. Identify the foods that contain carbohydrates: ? Rice. ? Corn. ? Milk. ? Strawberries. 2. Calculate how many servings you have of each food: ? 2 servings rice. ? 1 serving corn. ? 1 serving milk. ? 1 serving strawberries. 3. Multiply each number of servings by 15 g: ? 2 servings rice x 15 g = 30 g. ? 1 serving corn x 15 g = 15 g. ? 1 serving milk x 15 g = 15 g. ? 1 serving strawberries x 15 g = 15 g. 4. Add together all of the amounts to find the total grams of carbohydrates eaten: ? 30 g + 15 g + 15 g + 15 g = 75 g of carbohydrates total. This information is not intended to replace advice given to you by your health care provider. Make sure you discuss any questions you have with your health care provider. Document Released: 10/15/2005 Document Revised: 05/04/2016 Document Reviewed: 03/28/2016 Elsevier Interactive Patient Education  [employer].

## 2017-10-01 NOTE — Progress Notes (Signed)
Brittany Zhang Feb 16, 1966 [id number]    History:    Presents for annual exam.  2 cycles in the past year, greater than 6 months since last cycle. 2017 cycles were every 2-3 months, prior cycles monthly. BTL. Normal Pap and mammogram history. Right neck lipoma has seen a surgeon declined surgical removal. Had tried Ambien for insomnia,  did not feel well on.  Past medical history, past surgical history, family history and social history were all reviewed and documented in the EPIC chart. Homemaker. Son graduated from medical school in [place], coming to the [place] to complete residency. Daughter at [employer] state doing well.  ROS:  A ROS was performed and pertinent positives and negatives are included.  Exam:  Vitals:   10/01/17 1506  BP: 119/79  Weight: 135 lb 6.4 oz (61.4 kg)  Height: 5' 2"  (1.575 m)   Body mass index is 24.76 kg/m.   General appearance:  Normal right neck 3 cm lipoma asymptomatic Thyroid:  Symmetrical, normal in size, without palpable masses or nodularity. Respiratory  Auscultation:  Clear without wheezing or rhonchi Cardiovascular  Auscultation:  Regular rate, without rubs, murmurs or gallops  Edema/varicosities:  Not grossly evident Abdominal  Soft,nontender, without masses, guarding or rebound.  Liver/spleen:  No organomegaly noted  Hernia:  None appreciated  Skin  Inspection:  Grossly normal   Breasts: Examined lying and sitting.     Right: Without masses, retractions, discharge or axillary adenopathy.     Left: Without masses, retractions, discharge or axillary adenopathy. Gentitourinary   Inguinal/mons:  Normal without inguinal adenopathy  External genitalia:  Normal  BUS/Urethra/Skene's glands:  Normal  Vagina:  Normal  Cervix:  Normal  Uterus:   normal in size, shape and contour.  Midline and mobile  Adnexa/parametria:     Rt: Without masses or tenderness.   Lt: Without masses or tenderness.  Anus and perineum: Normal  Digital rectal exam: Normal  sphincter tone without palpated masses or tenderness  Assessment/Plan:  51 y.o. MAF G2 P2 for annual exam with no complaints, occasional insomnia.  Perimenopausal.   Plan: Reviewed may not have any further cycles. Menopause reviewed. SBE's, continue annual screening mammogram, calcium rich diet, vitamin D 2000 daily and regular exercise encouraged. Lipid panel normal 2017. Sleep hygiene reviewed. CBC, CMP, Pap normal 2016, new screening guidelines reviewed.  [place], 5:09 PM 10/01/2017

## 2017-10-02 LAB — CBC WITH DIFFERENTIAL/PLATELET
BASOS ABS: 32 {cells}/uL (ref 0–200)
Basophils Relative: 0.5 %
EOS ABS: 198 {cells}/uL (ref 15–500)
Eosinophils Relative: 3.1 %
HCT: 37 % (ref 35.0–45.0)
Hemoglobin: 12.4 g/dL (ref 11.7–15.5)
Lymphs Abs: 2208 cells/uL (ref 850–3900)
MCH: 27.7 pg (ref 27.0–33.0)
MCHC: 33.5 g/dL (ref 32.0–36.0)
MCV: 82.8 fL (ref 80.0–100.0)
MPV: 10.2 fL (ref 7.5–12.5)
Monocytes Relative: 7.3 %
NEUTROS PCT: 54.6 %
Neutro Abs: 3494 cells/uL (ref 1500–7800)
PLATELETS: 282 10*3/uL (ref 140–400)
RBC: 4.47 10*6/uL (ref 3.80–5.10)
RDW: 12.1 % (ref 11.0–15.0)
TOTAL LYMPHOCYTE: 34.5 %
WBC: 6.4 10*3/uL (ref 3.8–10.8)
WBCMIX: 467 {cells}/uL (ref 200–950)

## 2017-10-02 LAB — COMPREHENSIVE METABOLIC PANEL
AG RATIO: 1.4 (calc) (ref 1.0–2.5)
ALBUMIN MSPROF: 4.3 g/dL (ref 3.6–5.1)
ALT: 15 U/L (ref 6–29)
AST: 20 U/L (ref 10–35)
Alkaline phosphatase (APISO): 87 U/L (ref 33–115)
BUN: 15 mg/dL (ref 7–25)
CHLORIDE: 100 mmol/L (ref 98–110)
CO2: 29 mmol/L (ref 20–32)
Calcium: 9.7 mg/dL (ref 8.6–10.2)
Creat: 0.71 mg/dL (ref 0.50–1.10)
GLOBULIN: 3 g/dL (ref 1.9–3.7)
GLUCOSE: 92 mg/dL (ref 65–99)
POTASSIUM: 4 mmol/L (ref 3.5–5.3)
SODIUM: 137 mmol/L (ref 135–146)
TOTAL PROTEIN: 7.3 g/dL (ref 6.1–8.1)
Total Bilirubin: 0.3 mg/dL (ref 0.2–1.2)

## 2017-10-28 ENCOUNTER — Ambulatory Visit: Payer: Managed Care, Other (non HMO)

## 2020-08-02 ENCOUNTER — Ambulatory Visit (INDEPENDENT_AMBULATORY_CARE_PROVIDER_SITE_OTHER): Payer: 59 | Admitting: Nurse Practitioner

## 2020-08-02 ENCOUNTER — Other Ambulatory Visit: Payer: Self-pay

## 2020-08-02 ENCOUNTER — Encounter: Payer: Self-pay | Admitting: Nurse Practitioner

## 2020-08-02 VITALS — BP 118/78 | Ht 62.0 in | Wt 134.0 lb

## 2020-08-02 DIAGNOSIS — N951 Menopausal and female climacteric states: Secondary | ICD-10-CM

## 2020-08-02 DIAGNOSIS — Z124 Encounter for screening for malignant neoplasm of cervix: Secondary | ICD-10-CM | POA: Diagnosis not present

## 2020-08-02 DIAGNOSIS — Z01419 Encounter for gynecological examination (general) (routine) without abnormal findings: Secondary | ICD-10-CM

## 2020-08-02 NOTE — Progress Notes (Signed)
   Brittany Zhang 03/23/1966 [id number]   History:  54 y.o. G2P2 presents for annual exam. Postmenopausal - no HRT, no bleeding. Complains of vaginal dryness and pain with intercourse. She has been using OTC lubricants with minimal relief. Normal pap and mammogram history. Has not had a screening colonoscopy.   Gynecologic History Patient's last menstrual period was 08/02/2018.   Contraception: post menopausal status and tubal ligation Last Pap: 09/28/2015. Results were: normal Last mammogram: 10/08/2016. Results were: normal Last colonoscopy: Never   Past medical history, past surgical history, family history and social history were all reviewed and documented in the EPIC chart.  ROS:  A ROS was performed and pertinent positives and negatives are included.  Exam:  Vitals:   08/02/20 0857  BP: 118/78  Weight: 134 lb (60.8 kg)  Height: 5\' 2"  (1.575 m)   Body mass index is 24.51 kg/m.  General appearance:  Normal Thyroid:  Symmetrical, normal in size, without palpable masses or nodularity. Respiratory  Auscultation:  Clear without wheezing or rhonchi Cardiovascular  Auscultation:  Regular rate, without rubs, murmurs or gallops  Edema/varicosities:  Not grossly evident Abdominal  Soft,nontender, without masses, guarding or rebound.  Liver/spleen:  No organomegaly noted  Hernia:  None appreciated  Skin  Inspection:  Grossly normal   Breasts: Examined lying and sitting.   Right: Without masses, retractions, discharge or axillary adenopathy.   Left: Without masses, retractions, discharge or axillary adenopathy. Gentitourinary   Inguinal/mons:  Normal without inguinal adenopathy  External genitalia:  Normal  BUS/Urethra/Skene's glands:  Normal  Vagina:  Normal  Cervix:  Normal  Uterus:  Normal in size, shape and contour.  Midline and mobile  Adnexa/parametria:     Rt: Without masses or tenderness.   Lt: Without masses or tenderness.  Anus and perineum: Normal  Digital  rectal exam: Normal sphincter tone without palpated masses or tenderness  Assessment/Plan:  54 y.o. G2P2 for annual exam.   Well female exam with routine gynecological exam - Plan: CBC with Differential/Platelet, Comprehensive metabolic panel, Lipid panel, VITAMIN D 25 Hydroxy (Vit-D Deficiency, Fractures). Education provided on SBEs, importance of preventative screenings, current guidelines, high calcium diet, regular exercise, and multivitamin daily. Interested in [place] vaccine. Had Covid booster 1 week ago so I recommended waiting 2 months.  Menopausal vaginal dryness - Recommend Replens 2-3 times per week and coconut oil for intercourse. She complains of burning with OTC lubricants. We discussed estrogen cream if symptoms persist with consistent use.   Encounter for Papanicolaou smear for cervical cancer screening - Plan: PAP,TP IMGw/HPV RNA,rflx HPVTYPE16,18/45. Normal pap history.  Follow up in 1 year for annual.      [place] [facility], 9:15 AM 08/02/2020

## 2020-08-02 NOTE — Patient Instructions (Addendum)
Replens - Use 2-3 times a week at night Coconut oil for intercourse Estrace vaginal cream-prescription  Schedule mammogram [facility] [phone] [address]  [place], [place] [address]  Schedule colonoscopy [facility] [phone] [address] [place], [place] [address]   Health Maintenance, Female Adopting a healthy lifestyle and getting preventive care are important in promoting health and wellness. Ask your health care provider about:  The right schedule for you to have regular tests and exams.  Things you can do on your own to prevent diseases and keep yourself healthy. What should I know about diet, weight, and exercise? Eat a healthy diet   Eat a diet that includes plenty of vegetables, fruits, low-fat dairy products, and lean protein.  Do not eat a lot of foods that are high in solid fats, added sugars, or sodium. Maintain a healthy weight Body mass index (BMI) is used to identify weight problems. It estimates body fat based on height and weight. Your health care provider can help determine your BMI and help you achieve or maintain a healthy weight. Get regular exercise Get regular exercise. This is one of the most important things you can do for your health. Most adults should:  Exercise for at least 150 minutes each week. The exercise should increase your heart rate and make you sweat (moderate-intensity exercise).  Do strengthening exercises at least twice a week. This is in addition to the moderate-intensity exercise.  Spend less time sitting. Even light physical activity can be beneficial. Watch cholesterol and blood lipids Have your blood tested for lipids and cholesterol at 52 years of age, then have this test every 5 years. Have your cholesterol levels checked more often if:  Your lipid or cholesterol levels are high.  You are older than 53 years of age.  You are at high risk for heart disease. What should I know about  cancer screening? Depending on your health history and family history, you may need to have cancer screening at various ages. This may include screening for:  Breast cancer.  Cervical cancer.  Colorectal cancer.  Skin cancer.  Lung cancer. What should I know about heart disease, diabetes, and high blood pressure? Blood pressure and heart disease  High blood pressure causes heart disease and increases the risk of stroke. This is more likely to develop in people who have high blood pressure readings, are of African descent, or are overweight.  Have your blood pressure checked: ? Every 3-5 years if you are 45-72 years of age. ? Every year if you are 11 years old or older. Diabetes Have regular diabetes screenings. This checks your fasting blood sugar level. Have the screening done:  Once every three years after age 39 if you are at a normal weight and have a low risk for diabetes.  More often and at a younger age if you are overweight or have a high risk for diabetes. What should I know about preventing infection? Hepatitis B If you have a higher risk for hepatitis B, you should be screened for this virus. Talk with your health care provider to find out if you are at risk for hepatitis B infection. Hepatitis C Testing is recommended for:  Everyone born from [birth year] through 1963.  Anyone with known risk factors for hepatitis C. Sexually transmitted infections (STIs)  Get screened for STIs, including gonorrhea and chlamydia, if: ? You are sexually active and are younger than 52 years of age. ? You  are older than 52 years of age and your health care provider tells you that you are at risk for this type of infection. ? Your sexual activity has changed since you were last screened, and you are at increased risk for chlamydia or gonorrhea. Ask your health care provider if you are at risk.  Ask your health care provider about whether you are at high risk for HIV. Your health care  provider may recommend a prescription medicine to help prevent HIV infection. If you choose to take medicine to prevent HIV, you should first get tested for HIV. You should then be tested every 3 months for as long as you are taking the medicine. Pregnancy  If you are about to stop having your period (premenopausal) and you may become pregnant, seek counseling before you get pregnant.  Take 400 to 800 micrograms (mcg) of folic acid every day if you become pregnant.  Ask for birth control (contraception) if you want to prevent pregnancy. Osteoporosis and menopause Osteoporosis is a disease in which the bones lose minerals and strength with aging. This can result in bone fractures. If you are 75 years old or older, or if you are at risk for osteoporosis and fractures, ask your health care provider if you should:  Be screened for bone loss.  Take a calcium or vitamin D supplement to lower your risk of fractures.  Be given hormone replacement therapy (HRT) to treat symptoms of menopause. Follow these instructions at home: Lifestyle  Do not use any products that contain nicotine or tobacco, such as cigarettes, e-cigarettes, and chewing tobacco. If you need help quitting, ask your health care provider.  Do not use street drugs.  Do not share needles.  Ask your health care provider for help if you need support or information about quitting drugs. Alcohol use  Do not drink alcohol if: ? Your health care provider tells you not to drink. ? You are pregnant, may be pregnant, or are planning to become pregnant.  If you drink alcohol: ? Limit how much you use to 0-1 drink a day. ? Limit intake if you are breastfeeding.  Be aware of how much alcohol is in your drink. In the [place] one drink equals one 12 oz bottle of beer (355 mL), one 5 oz glass of wine (148 mL), or one 1 oz glass of hard liquor (44 mL). General instructions  Schedule regular health, dental, and eye exams.  Stay current  with your vaccines.  Tell your health care provider if: ? You often feel depressed. ? You have ever been abused or do not feel safe at home. Summary  Adopting a healthy lifestyle and getting preventive care are important in promoting health and wellness.  Follow your health care provider's instructions about healthy diet, exercising, and getting tested or screened for diseases.  Follow your health care provider's instructions on monitoring your cholesterol and blood pressure. This information is not intended to replace advice given to you by your health care provider. Make sure you discuss any questions you have with your health care provider. Document Revised: 10/08/2018 Document Reviewed: 10/08/2018 Elsevier Patient Education  2020 [employer].

## 2020-08-03 LAB — CBC WITH DIFFERENTIAL/PLATELET
Absolute Monocytes: 352 cells/uL (ref 200–950)
Basophils Absolute: 20 cells/uL (ref 0–200)
Basophils Relative: 0.4 %
Eosinophils Absolute: 245 cells/uL (ref 15–500)
Eosinophils Relative: 4.8 %
HCT: 38.5 % (ref 35.0–45.0)
Hemoglobin: 12.5 g/dL (ref 11.7–15.5)
Lymphs Abs: 1846 cells/uL (ref 850–3900)
MCH: 27.3 pg (ref 27.0–33.0)
MCHC: 32.5 g/dL (ref 32.0–36.0)
MCV: 84.1 fL (ref 80.0–100.0)
MPV: 10.2 fL (ref 7.5–12.5)
Monocytes Relative: 6.9 %
Neutro Abs: 2637 cells/uL (ref 1500–7800)
Neutrophils Relative %: 51.7 %
Platelets: 225 10*3/uL (ref 140–400)
RBC: 4.58 10*6/uL (ref 3.80–5.10)
RDW: 12.3 % (ref 11.0–15.0)
Total Lymphocyte: 36.2 %
WBC: 5.1 10*3/uL (ref 3.8–10.8)

## 2020-08-03 LAB — LIPID PANEL
Cholesterol: 173 mg/dL (ref <200)
HDL: 64 mg/dL (ref >=50)
LDL Cholesterol (Calc): 92 mg/dL (calc)
Non-HDL Cholesterol (Calc): 109 mg/dL (calc) (ref <130)
Total CHOL/HDL Ratio: 2.7 (calc) (ref <5.0)
Triglycerides: 84 mg/dL (ref <150)

## 2020-08-03 LAB — COMPREHENSIVE METABOLIC PANEL
AG Ratio: 1.5 (calc) (ref 1.0–2.5)
ALT: 28 U/L (ref 6–29)
AST: 26 U/L (ref 10–35)
Albumin: 4.3 g/dL (ref 3.6–5.1)
Alkaline phosphatase (APISO): 95 U/L (ref 37–153)
BUN: 18 mg/dL (ref 7–25)
CO2: 30 mmol/L (ref 20–32)
Calcium: 9.6 mg/dL (ref 8.6–10.4)
Chloride: 102 mmol/L (ref 98–110)
Creat: 0.72 mg/dL (ref 0.50–1.05)
Globulin: 2.9 g/dL (calc) (ref 1.9–3.7)
Glucose, Bld: 93 mg/dL (ref 65–99)
Potassium: 4.5 mmol/L (ref 3.5–5.3)
Sodium: 139 mmol/L (ref 135–146)
Total Bilirubin: 0.4 mg/dL (ref 0.2–1.2)
Total Protein: 7.2 g/dL (ref 6.1–8.1)

## 2020-08-03 LAB — VITAMIN D 25 HYDROXY (VIT D DEFICIENCY, FRACTURES): Vit D, 25-Hydroxy: 44 ng/mL (ref 30–100)

## 2020-08-04 LAB — PAP, TP IMAGING W/ HPV RNA, RFLX HPV TYPE 16,18/45: HPV DNA High Risk: NOT DETECTED

## 2020-08-05 ENCOUNTER — Other Ambulatory Visit: Payer: Self-pay | Admitting: Nurse Practitioner

## 2020-08-05 ENCOUNTER — Other Ambulatory Visit: Payer: Self-pay

## 2020-08-05 ENCOUNTER — Ambulatory Visit
Admission: RE | Admit: 2020-08-05 | Discharge: 2020-08-05 | Disposition: A | Discharge status: Home / Self Care | Payer: Managed Care, Other (non HMO) | Source: Ambulatory Visit | Attending: Nurse Practitioner | Admitting: Nurse Practitioner

## 2020-08-05 DIAGNOSIS — Z1231 Encounter for screening mammogram for malignant neoplasm of breast: Secondary | ICD-10-CM

## 2020-09-09 ENCOUNTER — Other Ambulatory Visit: Payer: Self-pay

## 2020-09-09 ENCOUNTER — Ambulatory Visit
Admission: RE | Admit: 2020-09-09 | Discharge: 2020-09-09 | Disposition: A | Discharge status: Home / Self Care | Payer: 59 | Source: Ambulatory Visit | Attending: Nurse Practitioner | Admitting: Nurse Practitioner

## 2021-08-03 ENCOUNTER — Ambulatory Visit: Payer: 59 | Admitting: Nurse Practitioner

## 2021-12-15 IMAGING — MG DIGITAL SCREENING BILAT W/ CAD
5 series · 5 of 5 positions shown · non-contrast
Comparison: Previous exam(s).

CLINICAL DATA: Screening.

EXAM:
DIGITAL SCREENING BILATERAL MAMMOGRAM WITH CAD

[L CC]
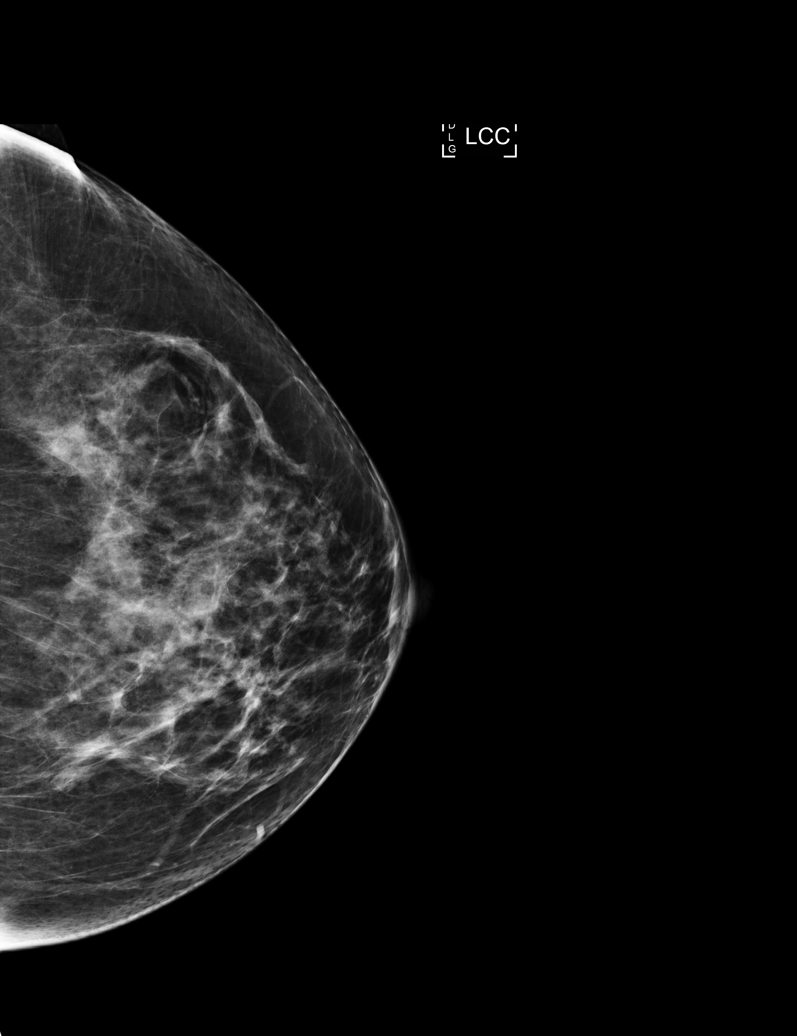

[R MLO (1 of 2)]
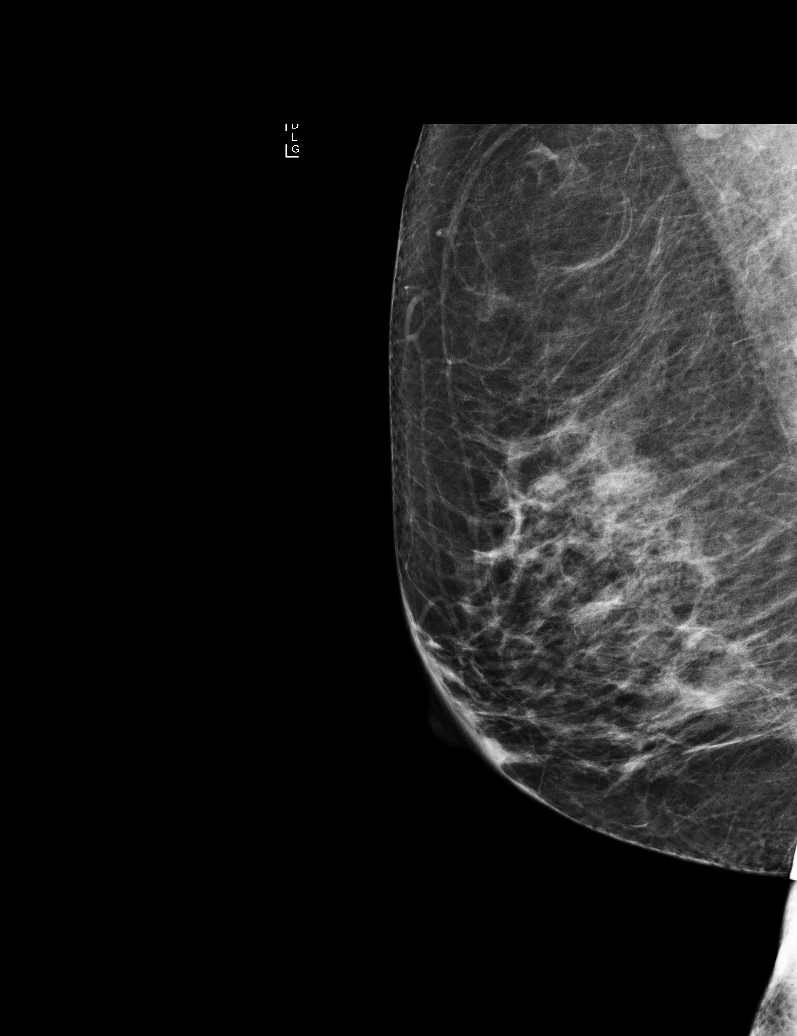

[R CC]
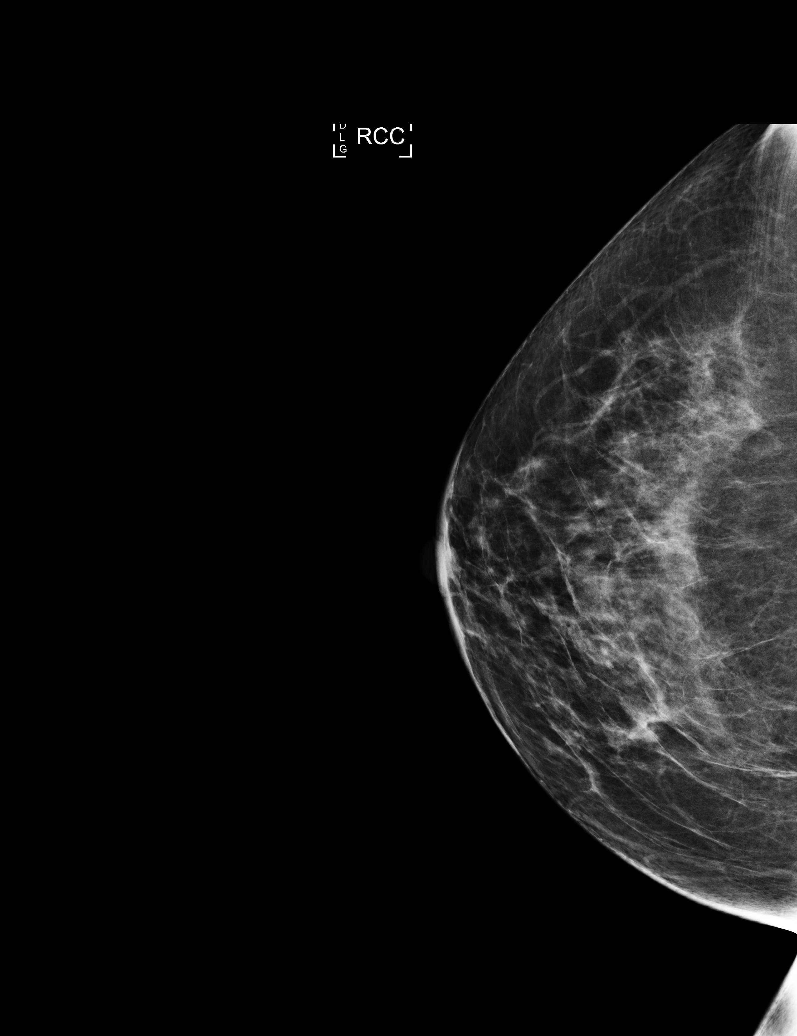

[R MLO (2 of 2)]
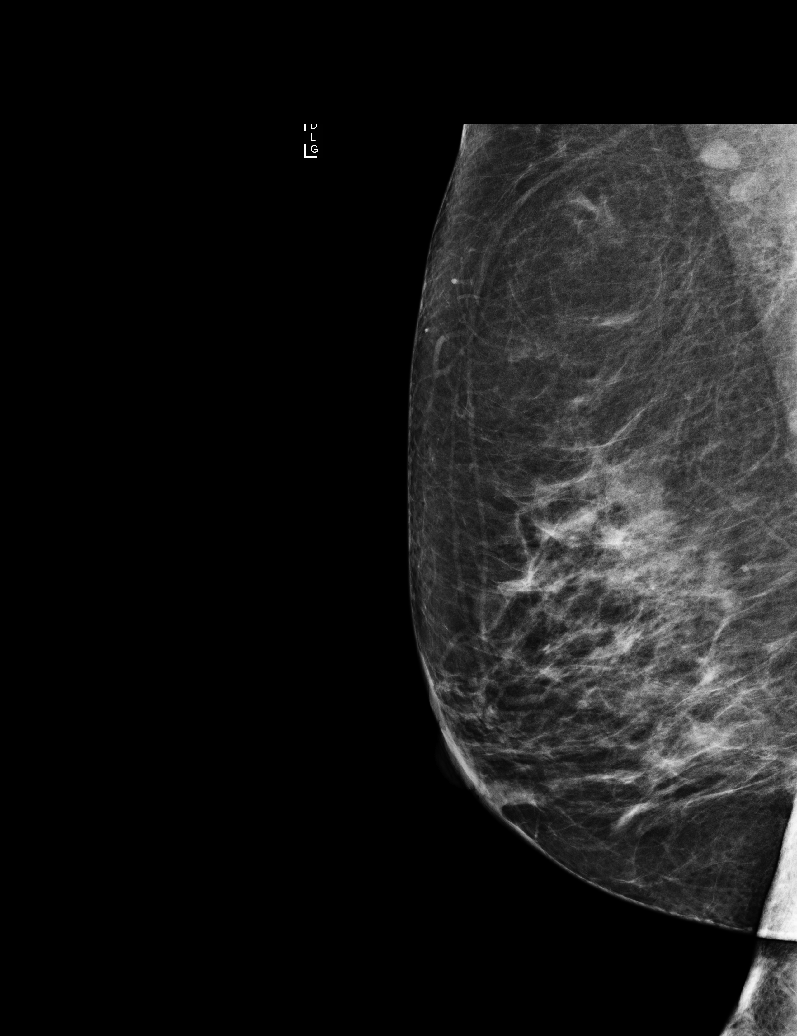

[L MLO]
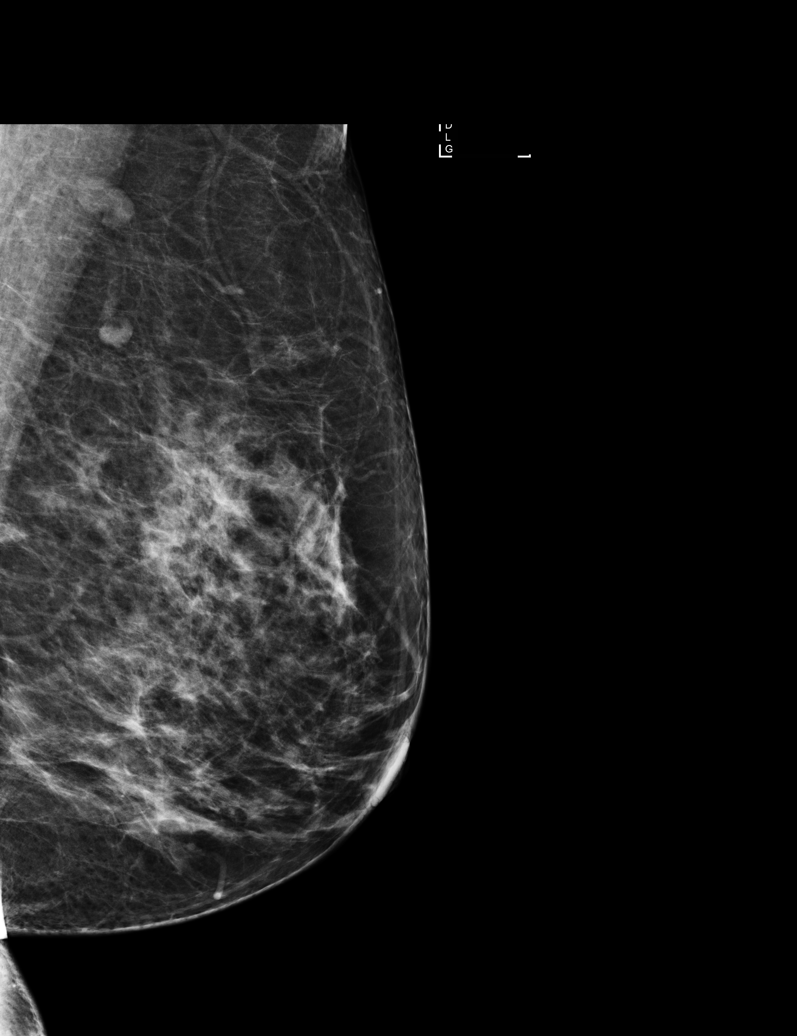

[5 of 5 positions shown; findings below may reference images not displayed]

ACR Breast Density Category c: The breast tissue is heterogeneously
dense, which may obscure small masses.
FINDINGS: There are no findings suspicious for malignancy. Images were
processed with CAD.
IMPRESSION: No mammographic evidence of malignancy. A result letter of this
screening mammogram will be mailed directly to the patient.

RECOMMENDATION:
Screening mammogram in one year. (Code:[id number])

BI-RADS CATEGORY  1: Negative.

## 2021-12-18 ENCOUNTER — Other Ambulatory Visit: Payer: Self-pay | Admitting: Nurse Practitioner

## 2021-12-18 DIAGNOSIS — Z1231 Encounter for screening mammogram for malignant neoplasm of breast: Secondary | ICD-10-CM

## 2021-12-25 ENCOUNTER — Ambulatory Visit
Admission: RE | Admit: 2021-12-25 | Discharge: 2021-12-25 | Disposition: A | Discharge status: Home / Self Care | Payer: 59 | Source: Ambulatory Visit | Attending: Nurse Practitioner | Admitting: Nurse Practitioner

## 2021-12-25 ENCOUNTER — Other Ambulatory Visit: Payer: Self-pay

## 2021-12-25 DIAGNOSIS — Z1231 Encounter for screening mammogram for malignant neoplasm of breast: Secondary | ICD-10-CM

## 2022-10-09 ENCOUNTER — Ambulatory Visit
Admission: RE | Admit: 2022-10-09 | Discharge: 2022-10-09 | Disposition: A | Discharge status: Home / Self Care | Payer: Managed Care, Other (non HMO) | Source: Ambulatory Visit | Attending: Internal Medicine | Admitting: Internal Medicine

## 2022-10-09 ENCOUNTER — Other Ambulatory Visit: Payer: Self-pay | Admitting: Internal Medicine

## 2022-10-09 DIAGNOSIS — R058 Other specified cough: Secondary | ICD-10-CM

## 2022-12-13 ENCOUNTER — Ambulatory Visit: Payer: Managed Care, Other (non HMO) | Admitting: Allergy & Immunology

## 2022-12-13 ENCOUNTER — Encounter: Payer: Self-pay | Admitting: Allergy & Immunology

## 2022-12-13 ENCOUNTER — Other Ambulatory Visit: Payer: Self-pay

## 2022-12-13 VITALS — BP 104/68 | HR 84 | Temp 98.1°F | Resp 16 | Ht 62.0 in | Wt 135.4 lb

## 2022-12-13 DIAGNOSIS — R053 Chronic cough: Secondary | ICD-10-CM | POA: Diagnosis not present

## 2022-12-13 DIAGNOSIS — J310 Chronic rhinitis: Secondary | ICD-10-CM | POA: Diagnosis not present

## 2022-12-13 DIAGNOSIS — J4520 Mild intermittent asthma, uncomplicated: Secondary | ICD-10-CM | POA: Diagnosis not present

## 2022-12-13 NOTE — Progress Notes (Signed)
NEW PATIENT  Date of Service/Encounter:  12/13/22  Consult requested by: Charlane Ferretti, MD   Assessment:   Chronic cough - without improvement with the albuterol treatment  Chronic non-allergic rhinitis - I wanted to follow up with more sensitive intradermal testing or blood work, but she declined  Mild intermittent asthma, uncomplicated  Plan/Recommendations:   1. Chronic cough - Lung testing did not look great, but it did not get any better with the albuterol treatment. - I do not think that this is asthma.  2. Chronic rhinitis - Testing was negative to the entire panel. - I think we need to do more sensitive testing with the intradermal (under the arm testing). - The CPT code is [phone] if you want to check with your insurance company. - We could also do the blood work to confirm as well.  - In the meantime, I would use the Allegra and nose spray if this happens again.  3. Return in about 3 months (around 03/13/2023).     This note in its entirety was forwarded to the Provider who requested this consultation.  Subjective:   Brittany Zhang is a 57 y.o. female presenting today for evaluation of  Chief Complaint  Patient presents with   Cough    Brittany Zhang has a history of the following: Patient Active Problem List   Diagnosis Date Noted   Chronic cough 12/16/2022   Non-allergic rhinitis 12/16/2022    History obtained from: chart review and patient.  Brittany Zhang was referred by Charlane Ferretti, MD.     Brittany Zhang is a 57 y.o. female presenting for an evaluation of possible allergies and asthma .   Asthma/Respiratory Symptom History: She reports that she has had a cough for two months. She used Allegra and a nose spray and OTC eye drops, it helped. She has never had an inhaler to see if that works. Nobody likes the nose sprays. She has lived here for 13 years and she never had issues before this cough started in 2023. She did get a round of antibiotics in 2023 and it  took several weeks to improve. She has been told that she is making a "lot of sounds" when she is sleeping. She also has a lot of postnasal drip. She does not have sneezing. She thinks that this might be all year long. Symptoms do not tend to get worse during a particular time of the year.   Allergic Rhinitis Symptom History: She never had allergies in [place] at all. When she travels to [place] and going there, she has a lot of rhinorrhea and coughing. She last went there in December 2022. She has never been allergy tested in the past.   She works at [employer]. She has been working there for 3 years. 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   Skin Symptom History: She has some dry skin. She uses some kind of OTC moisturizer for her skin.  She does not use a topical steroid at all.  She has never seen a dermatologist.  She has a 82 and 55 yo kiddos. Her son works at [employer] and her daughter is a Insurance account manager.  Otherwise, there is no history of other atopic diseases, including asthma, food allergies, drug allergies, stinging insect allergies, or contact dermatitis. There is no significant infectious history. Vaccinations are up to date.    Past Medical History: Patient Active Problem List  Diagnosis Date Noted   Chronic cough 12/16/2022   Non-allergic rhinitis 12/16/2022    Medication List:  Allergies as of 12/13/2022   No Known Allergies      Medication List        Accurate as of December 13, 2022 11:59 PM. If you have any questions, ask your nurse or doctor.          Fish Oil 1000 MG Caps Take by mouth.   ipratropium 0.03 % nasal spray Commonly known as: ATROVENT Place 2 sprays into both nostrils 3 (three) times daily as needed for rhinitis. Started by: Valentina Shaggy, MD   multivitamin tablet Take 1 tablet by mouth daily.   Vitamin D (Ergocalciferol) 50 MCG (2000 UT) Caps Take by mouth.         Birth History: non-contributory  Developmental History: non-contributory  Past Surgical History: History reviewed. No pertinent surgical history.   Family History: Family History  Problem Relation Age of Onset   Lung cancer Paternal Uncle    Prostate cancer Paternal Uncle    Diabetes Father      Social History: Janyiah lives at home with her family.  She lives in a house that is 55 years old.  There is carpeting throughout the entire house.  They have electric heating and central cooling.  There are no animals inside or outside of the home.  There are no dust mite covers on the bedding.  There is no tobacco exposure.  There is no fume, chemical, or dust exposure.  They do not have a HEPA filter.  They do not live near an interstate or industrial area.   Review of Systems  Constitutional: Negative.  Negative for fever, malaise/fatigue and weight loss.  HENT: Negative.  Negative for congestion, ear discharge and ear pain.        Positive for postnasal drip.  Eyes:  Negative for pain, discharge and redness.  Respiratory:  Positive for cough. Negative for sputum production, shortness of breath and wheezing.   Cardiovascular: Negative.  Negative for chest pain and palpitations.  Gastrointestinal:  Negative for abdominal pain and heartburn.  Skin: Negative.  Negative for itching and rash.  Neurological:  Negative for dizziness and headaches.  Endo/Heme/Allergies:  Negative for environmental allergies. Does not bruise/bleed easily.       Objective:   Blood pressure 104/68, pulse 84, temperature 98.1 F (36.7 C), temperature source Temporal, resp. rate 16, height 5' 2"$  (1.575 m), weight 135 lb 6.4 oz (61.4 kg), SpO2 97 %. Body mass index is 24.76 kg/m.     Physical Exam Constitutional:      Appearance: She is well-developed.  HENT:     Head: Normocephalic and atraumatic.     Right Ear: Tympanic membrane, ear canal and external ear normal. No drainage, swelling or  tenderness. Tympanic membrane is not injected, scarred, erythematous, retracted or bulging.     Left Ear: Tympanic membrane, ear canal and external ear normal. No drainage, swelling or tenderness. Tympanic membrane is not injected, scarred, erythematous, retracted or bulging.     Nose: Mucosal edema and rhinorrhea present. No nasal deformity or septal deviation.     Right Sinus: No maxillary sinus tenderness or frontal sinus tenderness.     Left Sinus: No maxillary sinus tenderness or frontal sinus tenderness.     Mouth/Throat:     Mouth: Mucous membranes are not pale and not dry.     Pharynx: Uvula midline.  Eyes:     General:  Right eye: No discharge.        Left eye: No discharge.     Conjunctiva/sclera: Conjunctivae normal.     Right eye: Right conjunctiva is not injected. No chemosis.    Left eye: Left conjunctiva is not injected. No chemosis.    Pupils: Pupils are equal, round, and reactive to light.  Cardiovascular:     Rate and Rhythm: Normal rate and regular rhythm.     Heart sounds: Normal heart sounds.  Pulmonary:     Effort: Pulmonary effort is normal. No tachypnea, accessory muscle usage or respiratory distress.     Breath sounds: Normal breath sounds. No wheezing, rhonchi or rales.  Chest:     Chest wall: No tenderness.  Abdominal:     Tenderness: There is no abdominal tenderness. There is no guarding or rebound.  Lymphadenopathy:     Head:     Right side of head: No submandibular, tonsillar or occipital adenopathy.     Left side of head: No submandibular, tonsillar or occipital adenopathy.     Cervical: No cervical adenopathy.  Skin:    Coloration: Skin is not pale.     Findings: No abrasion, erythema, petechiae or rash. Rash is not papular, urticarial or vesicular.  Neurological:     Mental Status: She is alert.      Diagnostic studies:    Spirometry: results abnormal (FEV1: 1.61/70%, FVC: 1.74/61%, FEV1/FVC: 93%).    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 Xopenex four puffs via MDI treatment given in clinic with no improvement.  Allergy Studies:     Airborne Adult Perc - 12/13/22 1520     Time Antigen Placed 1520    Allergen Manufacturer Lavella Hammock    Location Back    Number of Test 59    1. Control-Buffer 50% Glycerol Negative    2. Control-Histamine 1 mg/ml 2+    3. Albumin saline Negative    4. Diagonal Negative    5. [place] Negative    6. Johnson Negative    7. [place] Blue Negative    8. Meadow Fescue Negative    9. Perennial Rye Negative    10. Sweet Vernal Negative    11. Timothy Negative    12. Cocklebur Negative    13. Burweed Marshelder Negative    14. Ragweed, short Negative    15. Ragweed, Giant Negative    16. Plantain,  English Negative    17. Lamb's Quarters Negative    18. Sheep Sorrell Negative    19. Rough Pigweed Negative    20. Marsh Elder, Rough Negative    21. Mugwort, Common Negative    22. Ash mix Negative    23. Birch mix Negative    24. Beech American Negative    25. Box, Elder Negative    26. Cedar, red Negative    27. Cottonwood, [place] Negative    28. Elm mix Negative    29. Hickory Negative    30. Maple mix Negative    31. Oak, [place] mix Negative    32. Pecan Pollen Negative    33. Pine mix Negative    34. Sycamore Eastern Negative    35. [place], Black Pollen Negative    36. Alternaria alternata Negative    37. Cladosporium Herbarum Negative    38. Aspergillus mix Negative    39. Penicillium mix Negative    40. Bipolaris sorokiniana (Helminthosporium) Negative    41. Drechslera spicifera (Curvularia) Negative    42. Mucor plumbeus  Negative    43. Fusarium moniliforme Negative    44. Aureobasidium pullulans (pullulara) Negative    45. Rhizopus oryzae Negative    46. Botrytis cinera Negative    47. Epicoccum nigrum Negative    48. Phoma betae Negative    49. Candida Albicans Negative    50. Trichophyton mentagrophytes Negative    51. Mite, D Farinae   5,000 AU/ml Negative    52. Mite, D Pteronyssinus  5,000 AU/ml Negative    53. Cat Hair 10,000 BAU/ml Negative    54.  Dog Epithelia Negative    55. Mixed Feathers Negative    56. Horse Epithelia Negative    57. Cockroach, German Negative    58. Mouse Negative    59. Tobacco Leaf Negative             Allergy testing results were read and interpreted by myself, documented by clinical staff.         Salvatore Marvel, MD [facility]

## 2022-12-13 NOTE — Patient Instructions (Addendum)
1. Chronic cough - Lung testing did not look great, but it did not get any better with the albuterol treatment. - I do not think that this is asthma.  2. Chronic rhinitis - Testing was negative to the entire panel. - I think we need to do more sensitive testing with the intradermal (under the arm testing). - The CPT code is [phone] if you want to check with your insurance company. - We could also do the blood work to confirm as well.  - In the meantime, I would use the Allegra and nose spray if this happens again.  3. Return in about 3 months (around 03/13/2023).    Please inform us of any Emergency Department visits, hospitalizations, or changes in symptoms. Call us before going to the ED for breathing or allergy symptoms since we might be able to fit you in for a sick visit. Feel free to contact us anytime with any questions, problems, or concerns.  It was a pleasure to meet you today!  Websites that have reliable patient information: 1. American Academy of Asthma, Allergy, and Immunology: www.aaaai.org 2. Food Allergy Research and Education (FARE): foodallergy.org 3. Mothers of Asthmatics: http://www.asthmacommunitynetwork.org 4. American College of Allergy, Asthma, and Immunology: www.acaai.org   COVID-19 Vaccine Information can be found at: [email or web address] For questions related to vaccine distribution or appointments, please email [email or web address] or call [phone].   We realize that you might be concerned about having an allergic reaction to the COVID19 vaccines. To help with that concern, WE ARE OFFERING THE COVID19 VACCINES IN OUR OFFICE! Ask the front desk for dates!     "Like" [place] on Facebook and Instagram for our latest updates!      A healthy democracy works best when [employer] participate! Make sure you are registered to vote! If you have moved or changed any of your contact information, you will need  to get this updated before voting!  In some cases, you MAY be able to register to vote online: [email or web address]     Airborne Adult Perc - 12/13/22 1520     Time Antigen Placed [place]    Location Back    Number of Test 59    1. Control-Buffer 50% Glycerol Negative    2. Control-Histamine 1 mg/ml 2+    3. Albumin saline Negative    4. [place] Negative    5. [place] Negative    6. Johnson Negative    7. [place] Blue Negative    8. Meadow Fescue Negative    9. Perennial Rye Negative    10. Sweet Vernal Negative    11. Timothy Negative    12. Cocklebur Negative    13. Burweed Marshelder Negative    14. Ragweed, short Negative    15. Ragweed, Giant Negative    16. Plantain,  English Negative    17. Lamb's Quarters Negative    18. Sheep Sorrell Negative    19. Rough Pigweed Negative    20. Marsh Elder, Rough Negative    21. Mugwort, Common Negative    22. Ash mix Negative    23. Birch mix Negative    24. Beech American Negative    25. Box, Elder Negative    26. Cedar, red Negative    27. Cottonwood, [place] Negative    28. Elm mix Negative    29. Hickory Negative    30. Maple mix Negative    31. Oak, [place] mix Negative    32.  Pecan Pollen Negative    33. Pine mix Negative    34. Sycamore Eastern Negative    35. Bearden, Black Pollen Negative    36. Alternaria alternata Negative    37. Cladosporium Herbarum Negative    38. Aspergillus mix Negative    39. Penicillium mix Negative    40. Bipolaris sorokiniana (Helminthosporium) Negative    41. Drechslera spicifera (Curvularia) Negative    42. Mucor plumbeus Negative    43. Fusarium moniliforme Negative    44. Aureobasidium pullulans (pullulara) Negative    45. Rhizopus oryzae Negative    46. Botrytis cinera Negative    47. Epicoccum nigrum Negative    48. Phoma betae Negative    49. Candida Albicans Negative    50. Trichophyton mentagrophytes Negative     51. Mite, D Farinae  5,000 AU/ml Negative    52. Mite, D Pteronyssinus  5,000 AU/ml Negative    53. Cat Hair 10,000 BAU/ml Negative    54.  Dog Epithelia Negative    55. Mixed Feathers Negative    56. Horse Epithelia Negative    57. Cockroach, German Negative    58. Mouse Negative    59. Tobacco Leaf Negative

## 2022-12-16 ENCOUNTER — Encounter: Payer: Self-pay | Admitting: Allergy & Immunology

## 2022-12-16 DIAGNOSIS — R053 Chronic cough: Secondary | ICD-10-CM | POA: Insufficient documentation

## 2022-12-16 DIAGNOSIS — J310 Chronic rhinitis: Secondary | ICD-10-CM | POA: Insufficient documentation

## 2022-12-16 MED ORDER — IPRATROPIUM BROMIDE 0.03 % NA SOLN: 2.0000 | Freq: Three times a day (TID) | NASAL | 5 refills | Status: AC | PRN

## 2023-04-09 ENCOUNTER — Telehealth: Payer: Self-pay

## 2023-04-09 NOTE — Telephone Encounter (Signed)
Patient called to see if she can be given a discount if she pays her bill off at one time?  Please give patient a call.   Thanks
